# Patient Record
Sex: Female | Born: 1995
Health system: Southern US, Community
[De-identification: ages and names within clinical notes are randomized; demographics above are authoritative.]

## PROBLEM LIST (undated history)

## (undated) ENCOUNTER — Inpatient Hospital Stay (HOSPITAL_COMMUNITY): Payer: Self-pay

## (undated) DIAGNOSIS — D75839 Thrombocytosis, unspecified: Secondary | ICD-10-CM

## (undated) DIAGNOSIS — R55 Syncope and collapse: Secondary | ICD-10-CM

## (undated) DIAGNOSIS — I951 Orthostatic hypotension: Secondary | ICD-10-CM

## (undated) DIAGNOSIS — O24419 Gestational diabetes mellitus in pregnancy, unspecified control: Secondary | ICD-10-CM

## (undated) DIAGNOSIS — J45909 Unspecified asthma, uncomplicated: Secondary | ICD-10-CM

## (undated) DIAGNOSIS — R569 Unspecified convulsions: Secondary | ICD-10-CM

## (undated) DIAGNOSIS — R000 Tachycardia, unspecified: Secondary | ICD-10-CM

## (undated) DIAGNOSIS — J302 Other seasonal allergic rhinitis: Secondary | ICD-10-CM

## (undated) DIAGNOSIS — G90A Postural orthostatic tachycardia syndrome (POTS): Secondary | ICD-10-CM

## (undated) DIAGNOSIS — G40209 Localization-related (focal) (partial) symptomatic epilepsy and epileptic syndromes with complex partial seizures, not intractable, without status epilepticus: Secondary | ICD-10-CM

## (undated) DIAGNOSIS — R739 Hyperglycemia, unspecified: Secondary | ICD-10-CM

## (undated) DIAGNOSIS — F419 Anxiety disorder, unspecified: Secondary | ICD-10-CM

## (undated) DIAGNOSIS — I498 Other specified cardiac arrhythmias: Secondary | ICD-10-CM

## (undated) DIAGNOSIS — E669 Obesity, unspecified: Secondary | ICD-10-CM

## (undated) DIAGNOSIS — T7840XA Allergy, unspecified, initial encounter: Secondary | ICD-10-CM

## (undated) DIAGNOSIS — E119 Type 2 diabetes mellitus without complications: Secondary | ICD-10-CM

## (undated) DIAGNOSIS — D473 Essential (hemorrhagic) thrombocythemia: Secondary | ICD-10-CM

## (undated) HISTORY — DX: Localization-related (focal) (partial) symptomatic epilepsy and epileptic syndromes with complex partial seizures, not intractable, without status epilepticus: G40.209

## (undated) HISTORY — DX: Essential (hemorrhagic) thrombocythemia: D47.3

## (undated) HISTORY — DX: Syncope and collapse: R55

## (undated) HISTORY — DX: Other specified cardiac arrhythmias: I49.8

## (undated) HISTORY — DX: Orthostatic hypotension: I95.1

## (undated) HISTORY — DX: Postural orthostatic tachycardia syndrome (POTS): G90.A

## (undated) HISTORY — DX: Obesity, unspecified: E66.9

## (undated) HISTORY — DX: Allergy, unspecified, initial encounter: T78.40XA

## (undated) HISTORY — DX: Tachycardia, unspecified: R00.0

## (undated) HISTORY — PX: LINGUAL FRENECTOMY: SHX6357

## (undated) HISTORY — PX: TYMPANOSTOMY TUBE PLACEMENT: SHX32

## (undated) HISTORY — DX: Thrombocytosis, unspecified: D75.839

## (undated) HISTORY — PX: MYRINGOTOMY: SUR874

## (undated) HISTORY — DX: Other seasonal allergic rhinitis: J30.2

## (undated) HISTORY — DX: Hyperglycemia, unspecified: R73.9

## (undated) HISTORY — DX: Unspecified asthma, uncomplicated: J45.909

## (undated) HISTORY — DX: Type 2 diabetes mellitus without complications: E11.9

## (undated) HISTORY — DX: Unspecified convulsions: R56.9

---
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
## 2014-06-22 ENCOUNTER — Telehealth: Payer: Self-pay

## 2014-06-22 NOTE — Telephone Encounter (Signed)
I'm going to recommend that she call them (GYN). They should have someone always available and on-call as well. I hope this resolves soon.

## 2014-06-22 NOTE — Telephone Encounter (Signed)
She was seen at [facility], they started her on Junel 1/20. She got her period on Monday, it's been very heavy, blood clots, cramping.  She's tried calling their office and hasn't gotten a call back. She wants to know if you had any advice?

## 2014-06-23 NOTE — Telephone Encounter (Signed)
Pt. Notified.

## 2014-07-21 ENCOUNTER — Encounter: Payer: Self-pay | Admitting: Family Medicine

## 2014-09-27 DIAGNOSIS — G90A Postural orthostatic tachycardia syndrome (POTS): Secondary | ICD-10-CM | POA: Insufficient documentation

## 2014-09-27 DIAGNOSIS — I951 Orthostatic hypotension: Secondary | ICD-10-CM

## 2014-09-27 DIAGNOSIS — D75839 Thrombocytosis, unspecified: Secondary | ICD-10-CM | POA: Insufficient documentation

## 2014-09-27 DIAGNOSIS — D473 Essential (hemorrhagic) thrombocythemia: Secondary | ICD-10-CM | POA: Insufficient documentation

## 2014-09-27 DIAGNOSIS — J45909 Unspecified asthma, uncomplicated: Secondary | ICD-10-CM | POA: Insufficient documentation

## 2014-09-27 DIAGNOSIS — R739 Hyperglycemia, unspecified: Secondary | ICD-10-CM | POA: Insufficient documentation

## 2014-09-27 DIAGNOSIS — R000 Tachycardia, unspecified: Secondary | ICD-10-CM

## 2014-09-27 DIAGNOSIS — G40209 Localization-related (focal) (partial) symptomatic epilepsy and epileptic syndromes with complex partial seizures, not intractable, without status epilepticus: Secondary | ICD-10-CM | POA: Insufficient documentation

## 2014-09-27 DIAGNOSIS — E669 Obesity, unspecified: Secondary | ICD-10-CM | POA: Insufficient documentation

## 2014-10-09 ENCOUNTER — Encounter: Payer: Self-pay | Admitting: Family Medicine

## 2015-02-14 ENCOUNTER — Ambulatory Visit (INDEPENDENT_AMBULATORY_CARE_PROVIDER_SITE_OTHER): Payer: BLUE CROSS/BLUE SHIELD | Admitting: Family Medicine

## 2015-02-14 ENCOUNTER — Encounter: Payer: Self-pay | Admitting: Family Medicine

## 2015-02-14 VITALS — BP 111/71 | HR 70 | Temp 98.8°F | Ht 65.75 in | Wt 211.0 lb

## 2015-02-14 DIAGNOSIS — G40209 Localization-related (focal) (partial) symptomatic epilepsy and epileptic syndromes with complex partial seizures, not intractable, without status epilepticus: Secondary | ICD-10-CM

## 2015-02-14 DIAGNOSIS — N910 Primary amenorrhea: Secondary | ICD-10-CM | POA: Diagnosis not present

## 2015-02-14 DIAGNOSIS — E669 Obesity, unspecified: Secondary | ICD-10-CM

## 2015-02-14 DIAGNOSIS — Z23 Encounter for immunization: Secondary | ICD-10-CM

## 2015-02-14 DIAGNOSIS — Z3009 Encounter for other general counseling and advice on contraception: Secondary | ICD-10-CM | POA: Insufficient documentation

## 2015-02-14 DIAGNOSIS — N926 Irregular menstruation, unspecified: Secondary | ICD-10-CM | POA: Insufficient documentation

## 2015-02-14 NOTE — Assessment & Plan Note (Signed)
Discussed IUD as an option for contraception; may be better fit for her since she is having trouble remembering to take her pill at the same time of day every day; referral entered

## 2015-02-14 NOTE — Patient Instructions (Addendum)
Your next pneumonia vaccine (PPSV-23) will be due when you turn 20 years of age Return in 5 months for your next and last hepatitis B vaccine Think about the Gardisil vaccine, and you can get that any time up until age 90 You received the flu shot today; it should protect you against the flu virus over the coming months; it will take about two weeks for antibodies to develop; do try to stay away from hospitals, nursing homes, and daycares during peak flu season; taking extra vitamin C daily during flu season may help you avoid getting sick I'll suggest getting your next flu shot in the Fall of 2017 (October or November)

## 2015-02-14 NOTE — Assessment & Plan Note (Signed)
Praised patient for the weight loss over last several months
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
## 2015-02-14 NOTE — Assessment & Plan Note (Signed)
Managed by Dr. Manuella Ghazi

## 2015-02-14 NOTE — Assessment & Plan Note (Addendum)
Offered pregnancy test today; she declined; discussed importance of taking OCP at same time of day for effectiveness; will put in phone note to staff to call her Friday to see if she has started yet, offer pregnancy test then if not

## 2015-02-16 ENCOUNTER — Telehealth: Payer: Self-pay | Admitting: Family Medicine

## 2015-02-16 ENCOUNTER — Other Ambulatory Visit: Payer: BLUE CROSS/BLUE SHIELD

## 2015-02-16 DIAGNOSIS — N926 Irregular menstruation, unspecified: Secondary | ICD-10-CM

## 2015-02-16 DIAGNOSIS — Z0184 Encounter for antibody response examination: Secondary | ICD-10-CM | POA: Insufficient documentation

## 2015-02-16 DIAGNOSIS — Z111 Encounter for screening for respiratory tuberculosis: Secondary | ICD-10-CM

## 2015-02-16 DIAGNOSIS — Z789 Other specified health status: Secondary | ICD-10-CM | POA: Insufficient documentation

## 2015-02-16 NOTE — Telephone Encounter (Signed)
Patient notified, she will come by this afternoon for labs.

## 2015-02-16 NOTE — Assessment & Plan Note (Signed)
Check serum qualitative pregnancy test

## 2015-02-16 NOTE — Telephone Encounter (Signed)
-----   Message from [place] sent at 02/16/2015 10:46 AM EST ----- Regarding: RE: Check on patient Friday She still has not started her period. She took a home pregnancy test and it was negative. She wants to know if maybe she should get a blood test done. ----- Message -----    From: Arnetha Courser, MD    Sent: 02/14/2015   5:00 PM      To: Staci Acosta, CMA Subject: Check on patient Friday                        Please call patient on Friday Jan 27th to see if her period has come yet. If not, please recommend a urine pregnancy test.

## 2015-02-16 NOTE — Telephone Encounter (Signed)
Pt needs to get MMR and TB blood test done.  I've scheduled her for 03/01/15 for these.  Just wanted to give a heads up to get orders in and make sure we have the injections.  Please call her with any questions.

## 2015-02-16 NOTE — Telephone Encounter (Signed)
Yes, please do have her come in for blood test

## 2015-02-16 NOTE — Telephone Encounter (Signed)
I've entered the orders for the blood tests

## 2015-02-16 NOTE — Telephone Encounter (Signed)
Routing to provider, needs orders placed.

## 2015-02-17 LAB — HCG, SERUM, QUALITATIVE: hCG,Beta Subunit,Qual,Serum: NEGATIVE m[IU]/mL (ref <6)

## 2015-03-01 ENCOUNTER — Ambulatory Visit: Payer: BLUE CROSS/BLUE SHIELD

## 2015-03-01 DIAGNOSIS — Z111 Encounter for screening for respiratory tuberculosis: Secondary | ICD-10-CM

## 2015-03-01 DIAGNOSIS — Z789 Other specified health status: Secondary | ICD-10-CM

## 2015-03-01 DIAGNOSIS — Z0184 Encounter for antibody response examination: Secondary | ICD-10-CM

## 2015-03-02 LAB — MEASLES/MUMPS/RUBELLA IMMUNITY
MUMPS ABS, IGG: 16.8 AU/mL (ref >10.9)
RUBEOLA AB, IGG: 114 AU/mL (ref >29.9)
Rubella Antibodies, IGG: 2.93 index (ref >0.99)

## 2015-03-05 LAB — QUANTIFERON IN TUBE
QFT TB AG MINUS NIL VALUE: 0.01 IU/mL
QUANTIFERON NIL VALUE: 0.03 [IU]/mL
QUANTIFERON TB AG VALUE: 0.04 [IU]/mL
QUANTIFERON TB GOLD: NEGATIVE

## 2015-03-05 LAB — QUANTIFERON TB GOLD ASSAY (BLOOD)

## 2015-04-11 ENCOUNTER — Other Ambulatory Visit: Payer: Self-pay | Admitting: Obstetrics and Gynecology

## 2015-04-11 ENCOUNTER — Encounter: Payer: Self-pay | Admitting: Obstetrics and Gynecology

## 2015-04-11 ENCOUNTER — Ambulatory Visit (INDEPENDENT_AMBULATORY_CARE_PROVIDER_SITE_OTHER): Payer: BLUE CROSS/BLUE SHIELD | Admitting: Obstetrics and Gynecology

## 2015-04-11 VITALS — BP 123/83 | HR 93 | Ht 68.0 in | Wt 211.8 lb

## 2015-04-11 DIAGNOSIS — N926 Irregular menstruation, unspecified: Secondary | ICD-10-CM

## 2015-04-11 DIAGNOSIS — E669 Obesity, unspecified: Secondary | ICD-10-CM

## 2015-04-11 DIAGNOSIS — Z309 Encounter for contraceptive management, unspecified: Secondary | ICD-10-CM

## 2015-04-11 DIAGNOSIS — Z01419 Encounter for gynecological examination (general) (routine) without abnormal findings: Secondary | ICD-10-CM

## 2015-04-11 DIAGNOSIS — Z3009 Encounter for other general counseling and advice on contraception: Secondary | ICD-10-CM

## 2015-04-11 LAB — POCT URINE PREGNANCY: PREG TEST UR: NEGATIVE

## 2015-04-11 NOTE — Progress Notes (Signed)
  Subjective:     Diane Guerrero is a 20 y.o. female and is here for a comprehensive physical exam. The patient reports no problems, except pain at insertion with intercourse.  Social History   Social History  . Marital Status: Single    Spouse Name: N/A  . Number of Children: N/A  . Years of Education: N/A   Occupational History  . Not on file.   Social History Main Topics  . Smoking status: Never Smoker   . Smokeless tobacco: Never Used  . Alcohol Use: No  . Drug Use: No  . Sexual Activity: Yes    Birth Control/ Protection: Pill   Other Topics Concern  . Not on file   Social History Narrative   Health Maintenance  Topic Date Due  . PAP SMEAR  03/08/2015  . INFLUENZA VACCINE  08/21/2015  . TETANUS/TDAP  06/17/2023  . HIV Screening  Addressed    The following portions of the patient's history were reviewed and updated as appropriate: allergies, current medications, past family history, past medical history, past social history, past surgical history and problem list.  Review of Systems Pertinent items noted in HPI and remainder of comprehensive ROS otherwise negative.   Objective:    BP 123/83 mmHg  Pulse 93  Ht 5\' 8"  (1.727 m)  Wt 211 lb 12.8 oz (96.072 kg)  BMI 32.21 kg/m2  LMP 04/02/2015 General appearance: alert, cooperative and appears stated age Neck: no adenopathy, no carotid bruit, no JVD, supple, symmetrical, trachea midline and thyroid not enlarged, symmetric, no tenderness/mass/nodules Lungs: clear to auscultation bilaterally Breasts: normal appearance, no masses or tenderness Heart: regular rate and rhythm, S1, S2 normal, no murmur, click, rub or gallop Abdomen: soft, non-tender; bowel sounds normal; no masses,  no organomegaly Pelvic: cervix normal in appearance, external genitalia normal, no adnexal masses or tenderness, no cervical motion tenderness, rectovaginal septum normal, uterus normal size, shape, and consistency and vagina normal  without discharge    Assessment:    Healthy female exam. Contraception counseling; obesity      Plan:  Pap obtained RTC in 2 weeks for Kyleena insertion Estrace cream for massage at posterior vaginal introitus prn   See After Visit Summary for Counseling Recommendations

## 2015-04-11 NOTE — Patient Instructions (Signed)
Place annual gynecologic exam patient instructions here.

## 2015-04-13 LAB — CYTOLOGY - PAP

## 2015-04-19 ENCOUNTER — Telehealth: Payer: Self-pay

## 2015-04-19 NOTE — Telephone Encounter (Signed)
Error

## 2015-04-25 ENCOUNTER — Encounter: Payer: Self-pay | Admitting: Obstetrics and Gynecology

## 2015-04-25 ENCOUNTER — Ambulatory Visit (INDEPENDENT_AMBULATORY_CARE_PROVIDER_SITE_OTHER): Payer: BLUE CROSS/BLUE SHIELD | Admitting: Obstetrics and Gynecology

## 2015-04-25 VITALS — BP 141/79 | HR 87 | Ht 68.0 in | Wt 217.6 lb

## 2015-04-25 DIAGNOSIS — Z975 Presence of (intrauterine) contraceptive device: Secondary | ICD-10-CM

## 2015-04-25 LAB — POCT URINE PREGNANCY: Preg Test, Ur: NEGATIVE

## 2015-04-25 MED ORDER — LEVONORGESTREL 19.5 MG IU IUD: 1.0000 | INTRAUTERINE_SYSTEM | Freq: Once | INTRAUTERINE | Status: DC

## 2015-04-25 NOTE — Progress Notes (Signed)
Diane Guerrero is a 20 y.o. year old G34P0000 Caucasian female who presents for placement of a [place] IUD.  Patient's last menstrual period was 04/02/2015. BP 141/79 mmHg  Pulse 87  Ht 5\' 8"  (1.727 m)  Wt 217 lb 9.6 oz (98.703 kg)  BMI 33.09 kg/m2  LMP 04/02/2015 Last sexual intercourse was 3 weeks ago, and pregnancy test today was negative  The risks and benefits of the method and placement have been thouroughly reviewed with the patient and all questions were answered.  Specifically the patient is aware of failure rate of 01/998, expulsion of the IUD and of possible perforation.  The patient is aware of irregular bleeding due to the method and understands the incidence of irregular bleeding diminishes with time.  Signed copy of informed consent in chart.   Time out was performed.  A pederson speculum was placed in the vagina.  The cervix was visualized, prepped using Betadine, and grasped with a single tooth tenaculum. The uterus was found to be neutral and it sounded to 8 cm.   IUD placed per manufacturer's recommendations.   The strings were trimmed to 3 cm.   The patient was given post procedure instructions, including signs and symptoms of infection and to check for the strings after each menses or each month, and refraining from intercourse or anything in the vagina for 3 days.  She was given a  care card with date placed, and date  to be removed.    Niralya Ohanian Rockney Ghee, CNM

## 2015-04-25 NOTE — Patient Instructions (Signed)

## 2015-04-30 ENCOUNTER — Telehealth: Payer: Self-pay | Admitting: Obstetrics and Gynecology

## 2015-04-30 NOTE — Telephone Encounter (Signed)
Notified pt to hydrate and let us know if not any better

## 2015-04-30 NOTE — Telephone Encounter (Signed)
Pt had IUID put in and she started her period and now her left side lower abdomen is hurting swollen and tender, she wants to know if this is normal

## 2015-05-16 ENCOUNTER — Telehealth: Payer: Self-pay | Admitting: *Deleted

## 2015-05-16 ENCOUNTER — Other Ambulatory Visit: Payer: Self-pay | Admitting: Obstetrics and Gynecology

## 2015-05-16 MED ORDER — FLUCONAZOLE 150 MG PO TABS: 150.0000 mg | ORAL_TABLET | Freq: Once | ORAL | Status: DC

## 2015-05-16 NOTE — Telephone Encounter (Signed)
Please let her know I sent in a prescription for Diflucan, and symptoms should resolve within 1 week, if they don't to call Monday for an appt on Tues or Wed to be seen

## 2015-05-16 NOTE — Telephone Encounter (Signed)
Should i suggest her some monistat???

## 2015-05-16 NOTE — Telephone Encounter (Signed)
Notified pt she voiced understanding 

## 2015-05-16 NOTE — Telephone Encounter (Signed)
Patient called and states that she  is experiencing some discharge and vaginal itching and burning. Patient states that she don't know if it could be from  the IUD insertion or from her having a yeast infection. Patient was on antibiotic for 3 days for upper respiratory infection and a double ear infection . Patient is requesting a call back. Call back number is [phone].

## 2015-05-18 ENCOUNTER — Encounter: Payer: Self-pay | Admitting: *Deleted

## 2015-05-18 ENCOUNTER — Ambulatory Visit
Admission: EM | Admit: 2015-05-18 | Discharge: 2015-05-18 | Disposition: A | Discharge status: Home / Self Care | Payer: BLUE CROSS/BLUE SHIELD | Attending: Family Medicine | Admitting: Family Medicine

## 2015-05-18 DIAGNOSIS — R42 Dizziness and giddiness: Secondary | ICD-10-CM

## 2015-05-18 DIAGNOSIS — R5383 Other fatigue: Secondary | ICD-10-CM

## 2015-05-18 NOTE — ED Notes (Signed)
Patient started having symptoms of dizziness 3 days ago. Patient had a double ear infection and URI last week and was treated. Patient is a [employer].

## 2015-05-18 NOTE — ED Provider Notes (Signed)
CSN: [id number]     Arrival date & time 05/18/15  1933 History   First MD Initiated Contact with Patient 05/18/15 2036     Chief Complaint  Patient presents with  . Dizziness  . Weakness   (Consider location/radiation/quality/duration/timing/severity/associated sxs/prior Treatment) HPI Comments: 20 yo female with a c/o fatigue, low energy, and lightheadedness for 3 days. States has been working a lot, going to class, and not getting much sleep or rest. Has also been sick over the past 2-3 weeks with URI and bilateral otitis media for which she was treated. Denies any fevers, chills, numbness/tingling, headaches, vision changes, one-sided weakness, rash.   The history is provided by the patient.    Past Medical History  Diagnosis Date  . Seasonal allergies   . Syncope and collapse   . Asthma   . Complex partial seizure ([place])   . Obesity   . POTS (postural orthostatic tachycardia syndrome)   . Thrombocytosis ([place])   . Hyperglycemia    Past Surgical History  Procedure Laterality Date  . Myringotomy Bilateral 1996  . Lingual frenectomy  1996  . Tympanostomy tube placement     Family History  Problem Relation Age of Onset  . Emphysema Maternal Grandmother   . Allergies Maternal Grandmother   . Cancer Maternal Grandmother     bladder  . COPD Paternal Uncle   . Allergies Mother   . Asthma Mother   . Heart disease Mother   . Rheum arthritis Mother   . Lupus Mother   . Stroke Mother   . Heart disease Maternal Grandfather   . Cancer Maternal Grandfather     lung  . Lung disease Maternal Grandfather   . Cancer Paternal Grandmother     breast  . Diabetes Paternal Grandmother    Social History  Substance Use Topics  . Smoking status: Never Smoker   . Smokeless tobacco: Never Used  . Alcohol Use: No   OB History    Gravida Para Term Preterm AB TAB SAB Ectopic Multiple Living   0 0 0 0 0 0 0 0 0 0      Review of Systems  Allergies  Peanut oil and Suprax  Home  Medications   Prior to Admission medications   Medication Sig Start Date End Date Taking? Authorizing Provider  Levonorgestrel (KYLEENA) 19.5 MG IUD 1 Device by Intrauterine route once. 04/25/15  Yes Melody N Shambley, CNM  topiramate (TOPAMAX) 200 MG tablet Take 200 mg by mouth daily.   Yes Historical Provider, MD  Topiramate ER 200 MG CS24 Take 200 mg by mouth daily. 02/07/15 05/18/15 Yes Historical Provider, MD  traZODone (DESYREL) 50 MG tablet Take 25 mg by mouth at bedtime. 11/17/14  Yes Historical Provider, MD  albuterol (PROVENTIL HFA;VENTOLIN HFA) 108 (90 BASE) MCG/ACT inhaler Inhale 1-2 puffs into the lungs every 6 (six) hours as needed for wheezing or shortness of breath.    Historical Provider, MD  albuterol (PROVENTIL) (2.5 MG/3ML) 0.083% nebulizer solution Take 2.5 mg by nebulization every 6 (six) hours as needed for wheezing or shortness of breath.    Historical Provider, MD  beclomethasone (QVAR) 80 MCG/ACT inhaler Inhale 2 puffs into the lungs 2 (two) times daily. Reported on 02/14/2015    Historical Provider, MD  cetirizine (ZYRTEC) 10 MG tablet Take 10 mg by mouth daily.    Historical Provider, MD  Cholecalciferol (VITAMIN D-1000 MAX ST) 1000 units tablet Take 1,000 Units by mouth daily.    Historical Provider, MD  diphenhydrAMINE (BENADRYL) 25 MG tablet Take 25 mg by mouth every 6 (six) hours as needed.    Historical Provider, MD  EPINEPHrine (EPI-PEN) 0.3 mg/0.3 mL SOAJ injection Inject 0.3 mg into the muscle as needed.    Historical Provider, MD  fluconazole (DIFLUCAN) 150 MG tablet Take 1 tablet (150 mg total) by mouth once. Can take additional dose three days later if symptoms persist 05/16/15   Melody N Shambley, CNM  Spacer/Aero-Holding Chambers (AEROCHAMBER MV) inhaler Use as instructed 06/20/13   Juanito Doom, MD   Meds Ordered and Administered this Visit  Medications - No data to display  BP 148/69 mmHg  Pulse 89  Temp(Src) 98 F (36.7 C) (Oral)  Resp 18  Ht 5\' 8"   (1.727 m)  Wt 210 lb (95.255 kg)  BMI 31.94 kg/m2  SpO2 100%  LMP 04/26/2015 No data found.   Physical Exam  Constitutional: She is oriented to person, place, and time. She appears well-developed and well-nourished. No distress.  HENT:  Head: Normocephalic and atraumatic.  Right Ear: External ear and ear canal normal. Tympanic membrane is erythematous.  Left Ear: External ear and ear canal normal. Tympanic membrane is erythematous.  Nose: Nose normal. No nose lacerations, sinus tenderness, nasal deformity, septal deviation or nasal septal hematoma. No epistaxis.  No foreign bodies.  Mouth/Throat: Uvula is midline, oropharynx is clear and moist and mucous membranes are normal. No oropharyngeal exudate.  Eyes: Conjunctivae and EOM are normal. Pupils are equal, round, and reactive to light. Right eye exhibits no discharge. Left eye exhibits no discharge. No scleral icterus.  Neck: Normal range of motion. Neck supple. No thyromegaly present.  Cardiovascular: Normal rate, regular rhythm and normal heart sounds.   Pulmonary/Chest: Effort normal and breath sounds normal. No respiratory distress. She has no wheezes. She has no rales.  Lymphadenopathy:    She has no cervical adenopathy.  Neurological: She is alert and oriented to person, place, and time. She has normal reflexes. She displays normal reflexes. No cranial nerve deficit. She exhibits normal muscle tone. Coordination normal.  Skin: She is not diaphoretic.  Nursing note and vitals reviewed.   ED Course  Procedures (including critical care time)  Labs Review Labs Reviewed - No data to display  Imaging Review No results found.   Visual Acuity Review  Right Eye Distance:   Left Eye Distance:   Bilateral Distance:    Right Eye Near:   Left Eye Near:    Bilateral Near:         MDM   1. Other fatigue   2. Lightheadedness    1.  diagnosis reviewed with patient 2.  Recommend supportive treatment with rest, increased  fluids  3. Follow-up prn if symptoms worsen or don't improve    Norval Gable, MD 05/19/15 [phone]

## 2015-06-11 ENCOUNTER — Ambulatory Visit (INDEPENDENT_AMBULATORY_CARE_PROVIDER_SITE_OTHER): Payer: BLUE CROSS/BLUE SHIELD | Admitting: Obstetrics and Gynecology

## 2015-06-11 ENCOUNTER — Encounter: Payer: Self-pay | Admitting: Obstetrics and Gynecology

## 2015-06-11 VITALS — BP 99/71 | HR 74 | Wt 219.3 lb

## 2015-06-11 DIAGNOSIS — Z30431 Encounter for routine checking of intrauterine contraceptive device: Secondary | ICD-10-CM | POA: Diagnosis not present

## 2015-06-11 NOTE — Progress Notes (Signed)
Subjective:     Patient ID: Diane Guerrero, female   DOB: February 07, 1995, 20 y.o.   MRN: [id number]  HPI IUD placed 6 weeks ago, doing well, reports 7 days of light spotting this month with menses. Denies pain with sex.  Review of Systems See above    Objective:   Physical Exam A&O x4  well groomed female in no distress Blood pressure 99/71, pulse 74, weight 219 lb 4.8 oz (99.474 kg), last menstrual period 05/26/2015. Pelvic exam: normal external genitalia, vulva, vagina, cervix, uterus and adnexa, IUD string noted inside cervix.    Assessment:     IUD check     Plan:     Happy with IUD and desires to continue with it.  RTC as needed.  Elyan Vanwieren Beaver, CNM

## 2015-06-26 ENCOUNTER — Telehealth: Payer: Self-pay | Admitting: Obstetrics and Gynecology

## 2015-06-26 NOTE — Telephone Encounter (Signed)
Pt has been bleeding since 6/1 but very light. She was told to call if she bled past 7 days. She has no pain.

## 2015-06-28 NOTE — Telephone Encounter (Signed)
Pt had IUD placed on 04/25/15. I informed her that even BTB, or menses could occur with IUD but if severe pain, unusually heavy bleeding to contact office. Pt states she had called because she was told to if bleeding persisted past 7 days. Encourage to call if any further questions or concerns.

## 2015-07-16 ENCOUNTER — Ambulatory Visit: Payer: BLUE CROSS/BLUE SHIELD

## 2015-08-10 ENCOUNTER — Telehealth: Payer: Self-pay | Admitting: Family Medicine

## 2015-08-10 NOTE — Telephone Encounter (Signed)
Pt called and states she has IUD as of May,  she is in [place] and the last week or so her right breast seems larger and more tender than the other. Pt states there are no other symptoms and all was fine until IUD. Pt would like a call back. Pt states she understands it could be her lifestyle, however she does have breast cancer in her immediate family and would just feel better to have a call back. [phone].

## 2015-08-13 ENCOUNTER — Encounter: Payer: Self-pay | Admitting: Family Medicine

## 2015-08-13 NOTE — Telephone Encounter (Signed)
I returned her call, recommended she contact her gynecologist Patient is traveling She thinks she is fine; her right breast was swollen, it freaked her out I encouraged her to contact her GYN; so glad she is listening to her body and wanting to make sure everything is okay She agrees to call GYN

## 2015-08-16 ENCOUNTER — Ambulatory Visit
Admission: EM | Admit: 2015-08-16 | Discharge: 2015-08-16 | Disposition: A | Discharge status: Home / Self Care | Payer: BLUE CROSS/BLUE SHIELD | Attending: Family Medicine | Admitting: Family Medicine

## 2015-08-16 DIAGNOSIS — J0100 Acute maxillary sinusitis, unspecified: Secondary | ICD-10-CM | POA: Diagnosis not present

## 2015-08-16 DIAGNOSIS — B353 Tinea pedis: Secondary | ICD-10-CM

## 2015-08-16 DIAGNOSIS — J029 Acute pharyngitis, unspecified: Secondary | ICD-10-CM

## 2015-08-16 DIAGNOSIS — L600 Ingrowing nail: Secondary | ICD-10-CM

## 2015-08-16 LAB — RAPID STREP SCREEN (MED CTR MEBANE ONLY): Streptococcus, Group A Screen (Direct): NEGATIVE

## 2015-08-16 MED ORDER — KETOCONAZOLE 2 % EX CREA
1.0000 | TOPICAL_CREAM | Freq: Two times a day (BID) | CUTANEOUS | 1 refills | Status: DC
Start: 2015-08-16 — End: 2015-10-28

## 2015-08-16 MED ORDER — FEXOFENADINE-PSEUDOEPHED ER 180-240 MG PO TB24: 1.0000 | ORAL_TABLET | Freq: Every day | ORAL | 0 refills | Status: DC

## 2015-08-16 MED ORDER — MOXIFLOXACIN HCL 400 MG PO TABS: 400.0000 mg | ORAL_TABLET | Freq: Every day | ORAL | 0 refills | Status: DC

## 2015-08-16 MED ORDER — FLUTICASONE PROPIONATE 50 MCG/ACT NA SUSP: 2.0000 | Freq: Every day | NASAL | 0 refills | Status: DC

## 2015-08-16 NOTE — ED Triage Notes (Signed)
Patient complains of sore throat, cough, headache, sinus pain and pressure that has been constant for 3-4 weeks. Patient complains of ingrown right big toenail that started 8 months ago that she has been trying to doctor on herself. Patient states that she also has a rash on bilateral feet that worsened recently after living in a hotel.

## 2015-08-16 NOTE — ED Provider Notes (Signed)
[facility]    CSN: [id number] Arrival date & time: 08/16/15  1602  First Provider Contact:  First MD Initiated Contact with Patient 08/16/15 1807        History   Chief Complaint Chief Complaint  Patient presents with  . Sinusitis  . Ingrown Toenail    right big toe  . Rash    bilateral feet    HPI Diane Guerrero is a 20 y.o. female.   Patient's here because multiple problems.  #1 she's had sinus infection pharyngitis. 2-3 weeks. She reports postnasal drainage sore throat irritation. Pressure behind the ears. She is coughing but doesn't feel like the infections got into her chest yet. She does feel pressure in behind eyes as well. She does not smoke. She has a history of seasonal allergies and asthma.  #2 pharyngitis. She has strep the pharyngitis also been going on for about 2 weeks   #3 ingrown toenail on the right foot she says ingrown toenail this been going on for over 67 weeks. The nails gone into the medial border of the great toe. Soaks she started place cotton underneath the nail to lift it has not been successful    #4 rash she has a rash on the dorsum of the right foot and also the instep of the left foot is obviously she is scratching and there is some excoriation present as well    She does not smoke. No pertinent family medical history pertaining to today's visit. She is allergic to Suprax. She denies any significant medical problems.   The history is provided by the patient. No language interpreter was used.  Sinusitis  Pain details:    Location:  Maxillary   Quality:  Pressure   Severity:  Moderate   Duration:  2 weeks   Timing:  Constant Progression:  Worsening Chronicity:  New Context: not allergies, not chemical odor, not deviated nasal septum, not recent URI and not smoke inhalation   Relieved by:  Nothing Worsened by:  Nothing Ineffective treatments:  None tried Associated symptoms: congestion, rhinorrhea and sore throat     Associated symptoms: no chest pain   Sore throat:    Severity:  Moderate   Timing:  Constant   Progression:  Unchanged Risk factors: no allergic reaction, no asthma, no immune deficiency and no nasal cannula   Rash  Location:  Foot Foot rash location:  Top of R foot (arch of L foot) Severity:  Moderate Timing:  Constant Progression:  Worsening Context: not animal contact and not chemical exposure   Relieved by:  Nothing Associated symptoms: sore throat   Associated symptoms: no abdominal pain   Toe Pain  This is a new problem. The current episode started more than 1 week ago. The problem has been gradually worsening. Pertinent negatives include no chest pain and no abdominal pain. Nothing aggravates the symptoms. The treatment provided no relief.    Past Medical History:  Diagnosis Date  . Asthma   . Complex partial seizure ([place])   . Hyperglycemia   . Obesity   . POTS (postural orthostatic tachycardia syndrome)   . Seasonal allergies   . Syncope and collapse   . Thrombocytosis [facility])     Patient Active Problem List   Diagnosis Date Noted  . Rubella immune status not known 02/16/2015  . Immunity status testing 02/16/2015  . Screening-pulmonary TB 02/16/2015  . General counseling and advice on contraceptive management 02/14/2015  . Late period 02/14/2015  .  Asthma   . Complex partial seizure ([place])   . Obesity   . POTS (postural orthostatic tachycardia syndrome)   . Thrombocytosis ([place])   . Hyperglycemia   . Headache 06/23/2013  . Syncope 05/26/2013  . Asthma 04/27/2013  . Allergic rhinitis 04/27/2013    Past Surgical History:  Procedure Laterality Date  . LINGUAL FRENECTOMY  1996  . MYRINGOTOMY Bilateral 1996  . TYMPANOSTOMY TUBE PLACEMENT      OB History    Gravida Para Term Preterm AB Living   0 0 0 0 0 0   SAB TAB Ectopic Multiple Live Births   0 0 0 0         Home Medications    Prior to Admission medications   Medication Sig Start Date End  Date Taking? Authorizing Provider  albuterol (PROVENTIL HFA;VENTOLIN HFA) 108 (90 BASE) MCG/ACT inhaler Inhale 1-2 puffs into the lungs every 6 (six) hours as needed for wheezing or shortness of breath.   Yes Historical Provider, MD  beclomethasone (QVAR) 80 MCG/ACT inhaler Inhale 2 puffs into the lungs 2 (two) times daily. Reported on 02/14/2015   Yes Historical Provider, MD  cetirizine (ZYRTEC) 10 MG tablet Take 10 mg by mouth daily.   Yes Historical Provider, MD  Cholecalciferol (VITAMIN D-1000 MAX ST) 1000 units tablet Take 1,000 Units by mouth daily.   Yes Historical Provider, MD  diphenhydrAMINE (BENADRYL) 25 MG tablet Take 25 mg by mouth every 6 (six) hours as needed.   Yes Historical Provider, MD  EPINEPHrine (EPI-PEN) 0.3 mg/0.3 mL SOAJ injection Inject 0.3 mg into the muscle as needed.   Yes Historical Provider, MD  Levonorgestrel (KYLEENA) 19.5 MG IUD 1 Device by Intrauterine route once. 04/25/15  Yes Melody N Shambley, CNM  Topiramate ER (TROKENDI XR) 200 MG CP24 Take by mouth.   Yes Historical Provider, MD  traZODone (DESYREL) 50 MG tablet Take 25 mg by mouth at bedtime. 11/17/14  Yes Historical Provider, MD  albuterol (PROVENTIL) (2.5 MG/3ML) 0.083% nebulizer solution Take 2.5 mg by nebulization every 6 (six) hours as needed for wheezing or shortness of breath.    Historical Provider, MD  fexofenadine-pseudoephedrine (ALLEGRA-D ALLERGY & CONGESTION) 180-240 MG 24 hr tablet Take 1 tablet by mouth daily. 08/16/15   Frederich Cha, MD  fluconazole (DIFLUCAN) 150 MG tablet Take 1 tablet (150 mg total) by mouth once. Can take additional dose three days later if symptoms persist Patient not taking: Reported on 06/11/2015 05/16/15   Melody N Shambley, CNM  fluticasone (FLONASE) 50 MCG/ACT nasal spray Place 2 sprays into both nostrils daily. 08/16/15   Frederich Cha, MD  ketoconazole (NIZORAL) 2 % cream Apply 1 application topically 2 (two) times daily. 08/16/15   Frederich Cha, MD  moxifloxacin (AVELOX) 400  MG tablet Take 1 tablet (400 mg total) by mouth daily at 8 pm. 08/16/15   Frederich Cha, MD  Spacer/Aero-Holding Chambers (AEROCHAMBER MV) inhaler Use as instructed 06/20/13   Juanito Doom, MD  topiramate (TOPAMAX) 200 MG tablet Take 200 mg by mouth daily.    Historical Provider, MD  Topiramate ER 200 MG CS24 Take 200 mg by mouth daily. 02/07/15 05/18/15  Historical Provider, MD    Family History Family History  Problem Relation Age of Onset  . Allergies Mother   . Asthma Mother   . Heart disease Mother   . Rheum arthritis Mother   . Lupus Mother   . Stroke Mother   . Emphysema Maternal Grandmother   .  Allergies Maternal Grandmother   . Cancer Maternal Grandmother     bladder  . COPD Paternal Uncle   . Heart disease Maternal Grandfather   . Cancer Maternal Grandfather     lung  . Lung disease Maternal Grandfather   . Cancer Paternal Grandmother     breast  . Diabetes Paternal Grandmother     Social History Social History  Substance Use Topics  . Smoking status: Never Smoker  . Smokeless tobacco: Never Used  . Alcohol use No     Allergies   Peanut oil and Suprax [cefixime]   Review of Systems Review of Systems  HENT: Positive for congestion, rhinorrhea and sore throat.   Cardiovascular: Negative for chest pain.  Gastrointestinal: Negative for abdominal pain.  Skin: Positive for rash.  All other systems reviewed and are negative.    Physical Exam Triage Vital Signs ED Triage Vitals  Enc Vitals Group     BP 08/16/15 1721 111/77     Pulse Rate 08/16/15 1721 86     Resp 08/16/15 1721 16     Temp 08/16/15 1721 98.1 F (36.7 C)     Temp Source 08/16/15 1721 Oral     SpO2 08/16/15 1721 100 %     Weight 08/16/15 1721 220 lb (99.8 kg)     Height 08/16/15 1721 5\' 10"  (1.778 m)     Head Circumference --      Peak Flow --      Pain Score 08/16/15 1726 0     Pain Loc --      Pain Edu? --      Excl. in [place]? --    No data found.   Updated Vital Signs BP 111/77  (BP Location: Left Arm)   Pulse 86   Temp 98.1 F (36.7 C) (Oral)   Resp 16   Ht 5\' 10"  (1.778 m)   Wt 220 lb (99.8 kg)   LMP 07/21/2015 (Approximate)   SpO2 100%   BMI 31.57 kg/m   Visual Acuity Right Eye Distance:   Left Eye Distance:   Bilateral Distance:    Right Eye Near:   Left Eye Near:    Bilateral Near:     Physical Exam  Constitutional: She is oriented to person, place, and time. She appears well-developed and well-nourished.  HENT:  Head: Normocephalic.  Right Ear: Hearing normal.  Left Ear: Hearing normal.  Nose: Mucosal edema and rhinorrhea present. Right sinus exhibits maxillary sinus tenderness. Left sinus exhibits maxillary sinus tenderness.  Mouth/Throat: Posterior oropharyngeal erythema present.  Eyes: Pupils are equal, round, and reactive to light.  Neck: Neck supple.  Cardiovascular: Regular rhythm.   Pulmonary/Chest: Effort normal.  Musculoskeletal: She exhibits tenderness. She exhibits no deformity.       Feet:  Rash on the instep of the left foot and on the dorsum of the right foot and her right toes ingrown as well.  Neurological: She is alert and oriented to person, place, and time.  Skin: Skin is warm. Rash noted. There is erythema.  Psychiatric: She has a normal mood and affect.  Vitals reviewed.    UC Treatments / Results  Labs (all labs ordered are listed, but only abnormal results are displayed) Labs Reviewed  RAPID STREP SCREEN (NOT AT [facility])   EKG  EKG Interpretation None       Radiology No results found.  Procedures Procedures (including critical care time)  Medications Ordered in UC Medications - No data to display  Results for orders placed or performed during the hospital encounter of 08/16/15  Rapid strep screen  Result Value Ref Range   Streptococcus, Group A Screen (Direct) NEGATIVE NEGATIVE   Initial Impression / Assessment and Plan / UC Course  I have reviewed the triage vital signs and the nursing  notes.  Pertinent labs & imaging results that were available during my care of the patient were reviewed by me and considered in my medical decision making (see chart for details).  Clinical Course  Patient will have rapid strep test done. Will going to place her on Avelox for the ingrown toenail sinusitis. Initially consider Ceftin 500 but patient states her mother told that she had anaphylactic reaction to Suprax as child so we will hold off on the septal Sporn which revealed been the drug of choice I think and child Avelox. Will use Nizoral cream for the fungal infection of the foot. Or add Allegra-D with the Flonase and Avelox for sinus infection. I've instructed patient to return in about 1-3 weeks if ingrown toenails got better after warm soaks and Avelox may need to remove the toenail completely. I've informed her that she can answer me she was at the toenail removed at the urgent care.      Final Clinical Impressions(s) / UC Diagnoses   Final diagnoses:  Acute maxillary sinusitis, recurrence not specified  Acute pharyngitis, unspecified etiology  Tinea pedis of both feet  Ingrowing nail, right great toe    New Prescriptions New Prescriptions   FEXOFENADINE-PSEUDOEPHEDRINE (ALLEGRA-D ALLERGY & CONGESTION) 180-240 MG 24 HR TABLET    Take 1 tablet by mouth daily.   FLUTICASONE (FLONASE) 50 MCG/ACT NASAL SPRAY    Place 2 sprays into both nostrils daily.   KETOCONAZOLE (NIZORAL) 2 % CREAM    Apply 1 application topically 2 (two) times daily.   MOXIFLOXACIN (AVELOX) 400 MG TABLET    Take 1 tablet (400 mg total) by mouth daily at 8 pm.     Frederich Cha, MD 08/16/15 1902

## 2015-08-19 LAB — CULTURE, GROUP A STREP (THRC)

## 2015-10-28 ENCOUNTER — Encounter: Payer: Self-pay | Admitting: Emergency Medicine

## 2015-10-28 ENCOUNTER — Emergency Department: Payer: BLUE CROSS/BLUE SHIELD

## 2015-10-28 ENCOUNTER — Emergency Department
Admission: EM | Admit: 2015-10-28 | Discharge: 2015-10-28 | Disposition: A | Discharge status: Home / Self Care | Payer: BLUE CROSS/BLUE SHIELD | Attending: Emergency Medicine | Admitting: Emergency Medicine

## 2015-10-28 DIAGNOSIS — G40909 Epilepsy, unspecified, not intractable, without status epilepticus: Secondary | ICD-10-CM

## 2015-10-28 DIAGNOSIS — G43909 Migraine, unspecified, not intractable, without status migrainosus: Secondary | ICD-10-CM

## 2015-10-28 DIAGNOSIS — J45909 Unspecified asthma, uncomplicated: Secondary | ICD-10-CM | POA: Diagnosis not present

## 2015-10-28 DIAGNOSIS — R569 Unspecified convulsions: Secondary | ICD-10-CM | POA: Diagnosis present

## 2015-10-28 DIAGNOSIS — Z79899 Other long term (current) drug therapy: Secondary | ICD-10-CM | POA: Diagnosis not present

## 2015-10-28 LAB — CBC WITH DIFFERENTIAL/PLATELET
Basophils Absolute: 0.1 10*3/uL (ref 0–0.1)
Basophils Relative: 1 %
Eosinophils Absolute: 0.3 10*3/uL (ref 0–0.7)
Eosinophils Relative: 4 %
HEMATOCRIT: 45.7 % (ref 35.0–47.0)
HEMOGLOBIN: 15.8 g/dL (ref 12.0–16.0)
LYMPHS ABS: 2.8 10*3/uL (ref 1.0–3.6)
LYMPHS PCT: 32 %
MCH: 31 pg (ref 26.0–34.0)
MCHC: 34.7 g/dL (ref 32.0–36.0)
MCV: 89.2 fL (ref 80.0–100.0)
MONO ABS: 0.7 10*3/uL (ref 0.2–0.9)
MONOS PCT: 8 %
NEUTROS ABS: 4.9 10*3/uL (ref 1.4–6.5)
NEUTROS PCT: 55 %
Platelets: 400 10*3/uL (ref 150–440)
RBC: 5.12 MIL/uL (ref 3.80–5.20)
RDW: 12.2 % (ref 11.5–14.5)
WBC: 8.7 10*3/uL (ref 3.6–11.0)

## 2015-10-28 LAB — POCT PREGNANCY, URINE: Preg Test, Ur: NEGATIVE

## 2015-10-28 LAB — COMPREHENSIVE METABOLIC PANEL
ALK PHOS: 103 U/L (ref 38–126)
ALT: 38 U/L (ref 14–54)
ANION GAP: 6 (ref 5–15)
AST: 24 U/L (ref 15–41)
Albumin: 4.6 g/dL (ref 3.5–5.0)
BILIRUBIN TOTAL: 0.8 mg/dL (ref 0.3–1.2)
BUN: 9 mg/dL (ref 6–20)
CALCIUM: 9.5 mg/dL (ref 8.9–10.3)
CO2: 23 mmol/L (ref 22–32)
Chloride: 109 mmol/L (ref 101–111)
Creatinine, Ser: 0.55 mg/dL (ref 0.44–1.00)
GFR calc Af Amer: >60 mL/min (ref >60)
Glucose, Bld: 109 mg/dL — ABNORMAL HIGH (ref 65–99)
POTASSIUM: 3.9 mmol/L (ref 3.5–5.1)
Sodium: 138 mmol/L (ref 135–145)
TOTAL PROTEIN: 8.3 g/dL — AB (ref 6.5–8.1)

## 2015-10-28 LAB — MAGNESIUM: MAGNESIUM: 2 mg/dL (ref 1.7–2.4)

## 2015-10-28 MED ORDER — DIPHENHYDRAMINE HCL 50 MG/ML IJ SOLN
25.0000 mg | Freq: Once | INTRAMUSCULAR | Status: AC
  Administered 2015-10-28: 25 mg via INTRAVENOUS
  Filled 2015-10-28: qty 1

## 2015-10-28 MED ORDER — TRAMADOL HCL 50 MG PO TABS: 50.0000 mg | ORAL_TABLET | Freq: Four times a day (QID) | ORAL | 0 refills | Status: DC | PRN

## 2015-10-28 MED ORDER — LEVETIRACETAM 500 MG/5ML IV SOLN
1000.0000 mg | Freq: Once | INTRAVENOUS | Status: AC
  Administered 2015-10-28: 1000 mg via INTRAVENOUS
  Filled 2015-10-28: qty 10

## 2015-10-28 MED ORDER — SODIUM CHLORIDE 0.9 % IV BOLUS (SEPSIS)
1000.0000 mL | Freq: Once | INTRAVENOUS | Status: AC
  Administered 2015-10-28: 1000 mL via INTRAVENOUS

## 2015-10-28 MED ORDER — KETOROLAC TROMETHAMINE 30 MG/ML IJ SOLN
15.0000 mg | Freq: Once | INTRAMUSCULAR | Status: AC
  Administered 2015-10-28: 15 mg via INTRAVENOUS
  Filled 2015-10-28: qty 1

## 2015-10-28 MED ORDER — METOCLOPRAMIDE HCL 5 MG/ML IJ SOLN
10.0000 mg | Freq: Once | INTRAMUSCULAR | Status: AC
  Administered 2015-10-28: 10 mg via INTRAVENOUS
  Filled 2015-10-28: qty 2

## 2015-10-28 MED ORDER — LEVETIRACETAM 500 MG PO TABS: 500.0000 mg | ORAL_TABLET | Freq: Two times a day (BID) | ORAL | 0 refills | Status: DC

## 2015-10-28 MED ORDER — METOCLOPRAMIDE HCL 10 MG PO TABS: 10.0000 mg | ORAL_TABLET | Freq: Three times a day (TID) | ORAL | 1 refills | Status: DC | PRN

## 2015-10-28 NOTE — ED Provider Notes (Signed)
Time Seen: Approximately 1459 I have reviewed the triage notes  Chief Complaint: Migraine and Seizures   History of Present Illness: Diane Guerrero is a 20 y.o. female who presents with a history of partial complex seizure disorder. She states she's had a series of seizures since last evening. She states this is not terribly unusual and had same issue back and you have. There was a discussion at that time of starting her on Keppra. The patient states she's been on Topamax and has taken it as prescribed. She states that occasionally she will get a headache with her seizures and had a similar presentation with headaches and seizures 3 years ago. She denies any fever or head trauma. She denies any neck pain. She denies any focal weakness in either upper or lower extremities. The patient denies any persistent nausea and vomiting. She states this headache feels similar to previous migraine headaches in the past.   Past Medical History:  Diagnosis Date  . Asthma   . Complex partial seizure ([place])   . Hyperglycemia   . Obesity   . POTS (postural orthostatic tachycardia syndrome)   . Seasonal allergies   . Syncope and collapse   . Thrombocytosis [facility])     Patient Active Problem List   Diagnosis Date Noted  . Rubella immune status not known 02/16/2015  . Immunity status testing 02/16/2015  . Screening-pulmonary TB 02/16/2015  . General counseling and advice on contraceptive management 02/14/2015  . Late period 02/14/2015  . Asthma   . Complex partial seizure ([place])   . Obesity   . POTS (postural orthostatic tachycardia syndrome)   . Thrombocytosis ([place])   . Hyperglycemia   . Headache 06/23/2013  . Syncope 05/26/2013  . Asthma 04/27/2013  . Allergic rhinitis 04/27/2013    Past Surgical History:  Procedure Laterality Date  . LINGUAL FRENECTOMY  1996  . MYRINGOTOMY Bilateral 1996  . TYMPANOSTOMY TUBE PLACEMENT      Past Surgical History:  Procedure Laterality Date  . LINGUAL  FRENECTOMY  1996  . MYRINGOTOMY Bilateral 1996  . TYMPANOSTOMY TUBE PLACEMENT      Current Outpatient Rx  . Order #: [id number] Class: Print  . Order #: [id number] Class: Historical Med  . Order #: [id number] Class: Historical Med  . Order #: [id number] Class: Historical Med  . Order #: [id number] Class: Normal  . Order #: [id number] Class: Historical Med    Allergies:  Peanut oil and Suprax [cefixime]  Family History: Family History  Problem Relation Age of Onset  . Allergies Mother   . Asthma Mother   . Heart disease Mother   . Rheum arthritis Mother   . Lupus Mother   . Stroke Mother   . Emphysema Maternal Grandmother   . Allergies Maternal Grandmother   . Cancer Maternal Grandmother     bladder  . COPD Paternal Uncle   . Heart disease Maternal Grandfather   . Cancer Maternal Grandfather     lung  . Lung disease Maternal Grandfather   . Cancer Paternal Grandmother     breast  . Diabetes Paternal Grandmother     Social History: Social History  Substance Use Topics  . Smoking status: Never Smoker  . Smokeless tobacco: Never Used  . Alcohol use No     Review of Systems:   10 point review of systems was performed and was otherwise negative:  Constitutional: No fever Eyes: No visual disturbances ENT: No sore throat, ear pain Cardiac: No chest pain Respiratory: No shortness of  breath, wheezing, or stridor Abdomen: No abdominal pain, no vomiting, No diarrhea Endocrine: No weight loss, No night sweats Extremities: No peripheral edema, cyanosis Skin: No rashes, easy bruising Neurologic: No focal weakness, trouble with speech or swollowing Urologic: No dysuria, Hematuria, or urinary frequency Patient is not better time, urinated, defecated  Physical Exam:  ED Triage Vitals [10/28/15 1427]  Enc Vitals Group     BP (!) 146/82     Pulse Rate 99     Resp 18     Temp 97.7 F (36.5 C)     Temp Source Oral     SpO2 98 %     Weight 240 lb (108.9 kg)     Height 5\' 8"   (1.727 m)     Head Circumference      Peak Flow      Pain Score 10     Pain Loc      Pain Edu?      Excl. in [place]?     General: Awake , Alert , and Oriented times 3; GCS 15 Head: Normal cephalic , atraumatic Eyes: Pupils equal , round, reactive to light Nose/Throat: No nasal drainage, patent upper airway without erythema or exudate.  Neck: Supple, Full range of motion, No anterior adenopathy or palpable thyroid masses Lungs: Clear to ascultation without wheezes , rhonchi, or rales Heart: Regular rate, regular rhythm without murmurs , gallops , or rubs Abdomen: Soft, non tender without rebound, guarding , or rigidity; bowel sounds positive and symmetric in all 4 quadrants. No organomegaly .        Extremities: 2 plus symmetric pulses. No edema, clubbing or cyanosis Neurologic: normal ambulation, Motor symmetric without deficits, sensory intact Skin: warm, dry, no rashes   Labs:   All laboratory work was reviewed including any pertinent negatives or positives listed below:  Labs Reviewed  COMPREHENSIVE METABOLIC PANEL - Abnormal; Notable for the following:       Result Value   Glucose, Bld 109 (*)    Total Protein 8.3 (*)    All other components within normal limits  CBC WITH DIFFERENTIAL/PLATELET  MAGNESIUM  POC URINE PREG, ED  POCT PREGNANCY, URINE  Laboratory work was reviewed and showed no clinically significant abnormalities.   Radiology:  "Ct Head Wo Contrast  Result Date: 10/28/2015 CLINICAL DATA:  Seizures with headache. EXAM: CT HEAD WITHOUT CONTRAST TECHNIQUE: Contiguous axial images were obtained from the base of the skull through the vertex without intravenous contrast. COMPARISON:  MRI August 02, 2013 FINDINGS: Brain: No evidence of acute infarction, hemorrhage, hydrocephalus, extra-axial collection or mass lesion/mass effect. Vascular: No hyperdense vessel or unexpected calcification. Skull: Normal. Negative for fracture or focal lesion. Sinuses/Orbits: No acute  finding. Other:  No other abnormalities. IMPRESSION: No acute abnormalities. Electronically Signed   By: Dorise Bullion III M.D   On: 10/28/2015 16:34  "  I personally reviewed the radiologic studies     ED Course: Patient had symptomatic improvement with an IV fluid bolus, Reglan, Toradol, Benadryl. The patient states her headache is resolved at this point she had no further seizure activity here in emergency department. Because of been some discussion with her neurologist about starting on Her I felt we would frontload her with a gram of Keppra here and started on a low dose of 500 mg twice a day. I felt the patient clinically did not require lumbar puncture spinal tap and evaluation of her headache and her head CT was mainly performed because of the  associated seizure activity. He certainly sounds of a migraine equivalent headache. The patient denies any focal abnormalities and overall state symptomatic improvement. Clinical Course     Assessment: 'Complex partial seizure disorder Acute exacerbation of chronic migraine cephalgia     Plan:  The patient was advised to contact her neurologist for further outpatient treatment and evaluation " New Prescriptions   LEVETIRACETAM (KEPPRA) 500 MG TABLET    Take 1 tablet (500 mg total) by mouth 2 (two) times daily.   METOCLOPRAMIDE (REGLAN) 10 MG TABLET    Take 1 tablet (10 mg total) by mouth every 8 (eight) hours as needed for nausea.   TRAMADOL (ULTRAM) 50 MG TABLET    Take 1 tablet (50 mg total) by mouth every 6 (six) hours as needed.  " Patient was advised to return immediately if condition worsens. Patient was advised to follow up with their primary care physician or other specialized physicians involved in their outpatient care. The patient and/or family member/power of attorney had laboratory results reviewed at the bedside. All questions and concerns were addressed and appropriate discharge instructions were distributed by the nursing  staff.             Daymon Larsen, MD 10/28/15 [phone]

## 2015-10-28 NOTE — Discharge Instructions (Signed)
Please return immediately if condition worsens. Please contact her primary physician or the physician you were given for referral. If you have any specialist physicians involved in her treatment and plan please also contact them. Thank you for using [facility] [facility] emergency [facility]. ° °

## 2015-10-28 NOTE — ED Triage Notes (Signed)
Pt states she has been having seizures today and last night and headache for 2 wks that was controlled with ibuprofen but today it is worse. Takes topamax XR and states she has been taking it as normal. Hx migraines and seizures, feels like this is a migraine.

## 2015-10-28 NOTE — ED Triage Notes (Signed)
C/o nausea, light sensitivity, and lightheadedness.

## 2015-11-22 DIAGNOSIS — R197 Diarrhea, unspecified: Secondary | ICD-10-CM | POA: Diagnosis not present

## 2015-11-22 DIAGNOSIS — R112 Nausea with vomiting, unspecified: Secondary | ICD-10-CM | POA: Diagnosis not present

## 2015-11-30 DIAGNOSIS — G932 Benign intracranial hypertension: Secondary | ICD-10-CM | POA: Diagnosis not present

## 2015-11-30 DIAGNOSIS — Z8669 Personal history of other diseases of the nervous system and sense organs: Secondary | ICD-10-CM | POA: Diagnosis not present

## 2015-12-07 DIAGNOSIS — G932 Benign intracranial hypertension: Secondary | ICD-10-CM | POA: Diagnosis not present

## 2015-12-10 DIAGNOSIS — G971 Other reaction to spinal and lumbar puncture: Secondary | ICD-10-CM | POA: Diagnosis not present

## 2015-12-10 DIAGNOSIS — R112 Nausea with vomiting, unspecified: Secondary | ICD-10-CM | POA: Diagnosis not present

## 2015-12-10 DIAGNOSIS — R51 Headache: Secondary | ICD-10-CM | POA: Diagnosis not present

## 2015-12-10 DIAGNOSIS — Z5181 Encounter for therapeutic drug level monitoring: Secondary | ICD-10-CM | POA: Diagnosis not present

## 2015-12-11 DIAGNOSIS — G971 Other reaction to spinal and lumbar puncture: Secondary | ICD-10-CM | POA: Diagnosis not present

## 2015-12-11 DIAGNOSIS — Z5181 Encounter for therapeutic drug level monitoring: Secondary | ICD-10-CM | POA: Diagnosis not present

## 2015-12-11 DIAGNOSIS — R51 Headache: Secondary | ICD-10-CM | POA: Diagnosis not present

## 2015-12-11 DIAGNOSIS — R112 Nausea with vomiting, unspecified: Secondary | ICD-10-CM | POA: Diagnosis not present

## 2015-12-15 DIAGNOSIS — R51 Headache: Secondary | ICD-10-CM | POA: Diagnosis not present

## 2015-12-15 DIAGNOSIS — Z8669 Personal history of other diseases of the nervous system and sense organs: Secondary | ICD-10-CM | POA: Diagnosis not present

## 2015-12-15 DIAGNOSIS — G932 Benign intracranial hypertension: Secondary | ICD-10-CM | POA: Diagnosis not present

## 2015-12-17 DIAGNOSIS — M9901 Segmental and somatic dysfunction of cervical region: Secondary | ICD-10-CM | POA: Diagnosis not present

## 2015-12-17 DIAGNOSIS — M531 Cervicobrachial syndrome: Secondary | ICD-10-CM | POA: Diagnosis not present

## 2015-12-17 DIAGNOSIS — M791 Myalgia: Secondary | ICD-10-CM | POA: Diagnosis not present

## 2015-12-17 DIAGNOSIS — M6080 Other myositis, unspecified site: Secondary | ICD-10-CM | POA: Diagnosis not present

## 2015-12-17 DIAGNOSIS — M9903 Segmental and somatic dysfunction of lumbar region: Secondary | ICD-10-CM | POA: Diagnosis not present

## 2015-12-17 DIAGNOSIS — M9902 Segmental and somatic dysfunction of thoracic region: Secondary | ICD-10-CM | POA: Diagnosis not present

## 2015-12-17 DIAGNOSIS — M62830 Muscle spasm of back: Secondary | ICD-10-CM | POA: Diagnosis not present

## 2015-12-20 DIAGNOSIS — R4689 Other symptoms and signs involving appearance and behavior: Secondary | ICD-10-CM | POA: Diagnosis not present

## 2015-12-20 DIAGNOSIS — Z8669 Personal history of other diseases of the nervous system and sense organs: Secondary | ICD-10-CM | POA: Diagnosis not present

## 2015-12-21 DIAGNOSIS — R4689 Other symptoms and signs involving appearance and behavior: Secondary | ICD-10-CM | POA: Diagnosis not present

## 2015-12-21 DIAGNOSIS — Z8669 Personal history of other diseases of the nervous system and sense organs: Secondary | ICD-10-CM | POA: Diagnosis not present

## 2015-12-28 DIAGNOSIS — G43109 Migraine with aura, not intractable, without status migrainosus: Secondary | ICD-10-CM | POA: Diagnosis not present

## 2015-12-28 DIAGNOSIS — R569 Unspecified convulsions: Secondary | ICD-10-CM | POA: Diagnosis not present

## 2016-02-14 ENCOUNTER — Ambulatory Visit: Payer: BLUE CROSS/BLUE SHIELD | Admitting: Obstetrics and Gynecology

## 2016-02-27 ENCOUNTER — Ambulatory Visit: Payer: BLUE CROSS/BLUE SHIELD | Admitting: Obstetrics and Gynecology

## 2016-03-28 DIAGNOSIS — G43009 Migraine without aura, not intractable, without status migrainosus: Secondary | ICD-10-CM | POA: Diagnosis not present

## 2016-03-28 DIAGNOSIS — G43109 Migraine with aura, not intractable, without status migrainosus: Secondary | ICD-10-CM | POA: Diagnosis not present

## 2016-03-29 DIAGNOSIS — H5213 Myopia, bilateral: Secondary | ICD-10-CM | POA: Diagnosis not present

## 2016-04-11 ENCOUNTER — Ambulatory Visit (INDEPENDENT_AMBULATORY_CARE_PROVIDER_SITE_OTHER): Payer: 59 | Admitting: Obstetrics and Gynecology

## 2016-04-11 ENCOUNTER — Encounter: Payer: Self-pay | Admitting: Obstetrics and Gynecology

## 2016-04-11 VITALS — BP 129/82 | HR 112 | Ht 68.0 in | Wt 250.5 lb

## 2016-04-11 DIAGNOSIS — Z01411 Encounter for gynecological examination (general) (routine) with abnormal findings: Secondary | ICD-10-CM | POA: Diagnosis not present

## 2016-04-11 DIAGNOSIS — Z30431 Encounter for routine checking of intrauterine contraceptive device: Secondary | ICD-10-CM

## 2016-04-11 DIAGNOSIS — N9089 Other specified noninflammatory disorders of vulva and perineum: Secondary | ICD-10-CM | POA: Diagnosis not present

## 2016-04-11 DIAGNOSIS — E669 Obesity, unspecified: Secondary | ICD-10-CM | POA: Diagnosis not present

## 2016-04-11 MED ORDER — CLOBETASOL PROPIONATE 0.05 % EX OINT: 1.0000 "application " | TOPICAL_OINTMENT | Freq: Two times a day (BID) | CUTANEOUS | 2 refills | Status: DC

## 2016-04-11 NOTE — Progress Notes (Signed)
  Subjective:     Diane Guerrero is a 21 y.o. female and is here for a comprehensive physical exam. The patient reports problems - weight gain, eating late at night due to work.. 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 Exercising 3 days a week. Married caucasain.  Social History   Social History  . Marital status: Significant Other    Spouse name: N/A  . Number of children: N/A  . Years of education: N/A   Occupational History  . Not on file.   Social History Main Topics  . Smoking status: Never Smoker  . Smokeless tobacco: Never Used  . Alcohol use No  . Drug use: No  . Sexual activity: Yes    Birth control/ protection: IUD     Comment: kyleena   Other Topics Concern  . Not on file   Social History Narrative  . No narrative on file   Health Maintenance  Topic Date Due  . INFLUENZA VACCINE  08/21/2015  . PAP SMEAR  04/11/2018  . TETANUS/TDAP  06/17/2023  . HIV Screening  Addressed    The following portions of the patient's history were reviewed and updated as appropriate: allergies, current medications, past family history, past medical history, past social history, past surgical history and problem list.  Review of Systems A comprehensive review of systems was negative.   Objective:    General appearance: alert, cooperative and appears stated age Neck: no adenopathy, no carotid bruit, no JVD, supple, symmetrical, trachea midline and thyroid not enlarged, symmetric, no tenderness/mass/nodules Lungs: clear to auscultation bilaterally Breasts: normal appearance, no masses or tenderness Heart: regular rate and rhythm, S1, S2 normal, no murmur, click, rub or gallop Abdomen: soft, non-tender; bowel sounds normal; no masses,  no organomegaly Pelvic: cervix normal in appearance, external genitalia normal, no adnexal masses or tenderness, no cervical motion tenderness, rectovaginal septum normal, uterus normal size, shape, and consistency, vagina normal without discharge and large  skin tag on inner left thigh near groin, gets irritated ith shaving and chaffed at times    Assessment:    Healthy female exam. Obesity, IUD check, dysparenia, skin tag removal     Plan:  Instructed to continue estrace cream to lower perineum qod and add clobex ointment qod prn. Large skin tag removed without difficulty Encouraged weight loss. RTC 1 year or as needed.  Fitzgerald Dunne Gayla Medicus, CNM   See After Visit Summary for Counseling Recommendations

## 2016-12-26 ENCOUNTER — Telehealth: Payer: Self-pay | Admitting: Family Medicine

## 2016-12-26 NOTE — Telephone Encounter (Signed)
Copied from [place] [id number]. Topic: Quick Communication - See Telephone Encounter >> Dec 26, 2016 10:27 AM Cleaster Corin, NT wrote: CRM for notification. See Telephone encounter for:   12/26/16. [employer] called again about medical records request

## 2017-01-16 NOTE — Telephone Encounter (Signed)
Please see media tab in chart review.  The records request was processed by Mariane Masters w/[employer] on 01/02/17 & 01/08/17.  An invoice has been sent to [facility] (on behalf of [place]) on the above dates.  Once patient is received then records will be released by [employer] (our medical record processing company).

## 2017-03-02 ENCOUNTER — Encounter: Payer: Self-pay | Admitting: Certified Nurse Midwife

## 2017-04-14 ENCOUNTER — Encounter: Payer: 59 | Admitting: Obstetrics and Gynecology

## 2017-07-21 ENCOUNTER — Encounter: Payer: Self-pay | Admitting: Obstetrics and Gynecology

## 2017-07-21 ENCOUNTER — Ambulatory Visit: Payer: No Typology Code available for payment source | Admitting: Obstetrics and Gynecology

## 2017-07-21 VITALS — BP 121/84 | HR 83 | Wt 252.1 lb

## 2017-07-21 DIAGNOSIS — Z30432 Encounter for removal of intrauterine contraceptive device: Secondary | ICD-10-CM | POA: Diagnosis not present

## 2017-07-21 MED ORDER — NORETHIN ACE-ETH ESTRAD-FE 1-20 MG-MCG PO TABS: 1.0000 | ORAL_TABLET | Freq: Every day | ORAL | 4 refills | Status: DC

## 2017-07-21 NOTE — Progress Notes (Signed)
Diane Guerrero is a 21 y.o. year old [id number] Caucasian female who presents for removal of a Mirena IUD. Her Mirena IUD was placed 2015. She has been amenorrhea since insertion, but desires a pregnancy in the near future and wants to switch back to OCPs in leu of that. Denies any other concerns, is past due for AE,   No LMP recorded. (Menstrual status: IUD). BP 121/84   Pulse 83   Wt 252 lb 1.6 oz (114.4 kg)   BMI 38.33 kg/m   Time out was performed.  A pederson speculum was placed in the vagina.  The cervix was visualized, and the strings were visible. They were grasped and the Mirena was easily removed intact without complications.   F/U 4-6 weeks for AE Switch to Junel and encouraged to use back up method for the first month.  Juanjesus Pepperman Rockney Ghee, CNM

## 2017-08-28 ENCOUNTER — Encounter: Payer: Self-pay | Admitting: Obstetrics and Gynecology

## 2017-08-28 ENCOUNTER — Ambulatory Visit (INDEPENDENT_AMBULATORY_CARE_PROVIDER_SITE_OTHER): Payer: No Typology Code available for payment source | Admitting: Obstetrics and Gynecology

## 2017-08-28 VITALS — BP 116/79 | HR 92 | Ht 69.0 in | Wt 244.4 lb

## 2017-08-28 DIAGNOSIS — Z01419 Encounter for gynecological examination (general) (routine) without abnormal findings: Secondary | ICD-10-CM

## 2017-08-28 NOTE — Progress Notes (Signed)
  Subjective:     Diane Guerrero is a married white 22 y.o. female and is here for a comprehensive physical exam. Working FT at [employer]. Is sexually active with recent switch to OCPs for contraception. The patient reports no problems.  Social History   Socioeconomic History  . Marital status: Married    Spouse name: Not on file  . Number of children: Not on file  . Years of education: Not on file  . Highest education level: Not on file  Occupational History  . Not on file  Social Needs  . Financial resource strain: Not on file  . Food insecurity:    Worry: Not on file    Inability: Not on file  . Transportation needs:    Medical: Not on file    Non-medical: Not on file  Tobacco Use  . Smoking status: Never Smoker  . Smokeless tobacco: Never Used  Substance and Sexual Activity  . Alcohol use: No  . Drug use: No  . Sexual activity: Yes    Birth control/protection: Pill    Comment: kyleena  Lifestyle  . Physical activity:    Days per week: Not on file    Minutes per session: Not on file  . Stress: Not on file  Relationships  . Social connections:    Talks on phone: Not on file    Gets together: Not on file    Attends religious service: Not on file    Active member of club or organization: Not on file    Attends meetings of clubs or organizations: Not on file    Relationship status: Not on file  . Intimate partner violence:    Fear of current or ex partner: Not on file    Emotionally abused: Not on file    Physically abused: Not on file    Forced sexual activity: Not on file  Other Topics Concern  . Not on file  Social History Narrative  . Not on file   Health Maintenance  Topic Date Due  . INFLUENZA VACCINE  08/20/2017  . PAP SMEAR  04/11/2018  . TETANUS/TDAP  06/17/2023  . HIV Screening  Addressed    The following portions of the patient's history were reviewed and updated as appropriate: allergies, current medications, past family history, past medical  history, past social history, past surgical history and problem list.  Review of Systems Pertinent items noted in HPI and remainder of comprehensive ROS otherwise negative.   Objective:    General appearance: alert, cooperative and appears stated age Neck: no adenopathy, no carotid bruit, no JVD, supple, symmetrical, trachea midline and thyroid not enlarged, symmetric, no tenderness/mass/nodules Lungs: clear to auscultation bilaterally Breasts: normal appearance, no masses or tenderness Heart: regular rate and rhythm, S1, S2 normal, no murmur, click, rub or gallop Abdomen: soft, non-tender; bowel sounds normal; no masses,  no organomegaly Pelvic: cervix normal in appearance, external genitalia normal, no adnexal masses or tenderness, no cervical motion tenderness, rectovaginal septum normal, uterus normal size, shape, and consistency and vagina normal without discharge    Assessment:    Healthy female exam. obesity     Plan:  RTC 1 year or as needed.   See After Visit Summary for Counseling Recommendations

## 2017-08-28 NOTE — Patient Instructions (Signed)
Preventive Care 18-39 Years, Female Preventive care refers to lifestyle choices and visits with your health care provider that can promote health and wellness. What does preventive care include?  A yearly physical exam. This is also called an annual well check.  Dental exams once or twice a year.  Routine eye exams. Ask your health care provider how often you should have your eyes checked.  Personal lifestyle choices, including: ? Daily care of your teeth and gums. ? Regular physical activity. ? Eating a healthy diet. ? Avoiding tobacco and drug use. ? Limiting alcohol use. ? Practicing safe sex. ? Taking vitamin and mineral supplements as recommended by your health care provider. What happens during an annual well check? The services and screenings done by your health care provider during your annual well check will depend on your age, overall health, lifestyle risk factors, and family history of disease. Counseling Your health care provider may ask you questions about your:  Alcohol use.  Tobacco use.  Drug use.  Emotional well-being.  Home and relationship well-being.  Sexual activity.  Eating habits.  Work and work Statistician.  Method of birth control.  Menstrual cycle.  Pregnancy history.  Screening You may have the following tests or measurements:  Height, weight, and BMI.  Diabetes screening. This is done by checking your blood sugar (glucose) after you have not eaten for a while (fasting).  Blood pressure.  Lipid and cholesterol levels. These may be checked every 5 years starting at age 38.  Skin check.  Hepatitis C blood test.  Hepatitis B blood test.  Sexually transmitted disease (STD) testing.  BRCA-related cancer screening. This may be done if you have a family history of breast, ovarian, tubal, or peritoneal cancers.  Pelvic exam and Pap test. This may be done every 3 years starting at age 38. Starting at age 30, this may be done  every 5 years if you have a Pap test in combination with an HPV test.  Discuss your test results, treatment options, and if necessary, the need for more tests with your health care provider. Vaccines Your health care provider may recommend certain vaccines, such as:  Influenza vaccine. This is recommended every year.  Tetanus, diphtheria, and acellular pertussis (Tdap, Td) vaccine. You may need a Td booster every 10 years.  Varicella vaccine. You may need this if you have not been vaccinated.  HPV vaccine. If you are 39 or younger, you may need three doses over 6 months.  Measles, mumps, and rubella (MMR) vaccine. You may need at least one dose of MMR. You may also need a second dose.  Pneumococcal 13-valent conjugate (PCV13) vaccine. You may need this if you have certain conditions and were not previously vaccinated.  Pneumococcal polysaccharide (PPSV23) vaccine. You may need one or two doses if you smoke cigarettes or if you have certain conditions.  Meningococcal vaccine. One dose is recommended if you are age 68-21 years and a first-year college student living in a residence hall, or if you have one of several medical conditions. You may also need additional booster doses.  Hepatitis A vaccine. You may need this if you have certain conditions or if you travel or work in places where you may be exposed to hepatitis A.  Hepatitis B vaccine. You may need this if you have certain conditions or if you travel or work in places where you may be exposed to hepatitis B.  Haemophilus influenzae type b (Hib) vaccine. You may need this  if you have certain risk factors.  Talk to your health care provider about which screenings and vaccines you need and how often you need them. This information is not intended to replace advice given to you by your health care provider. Make sure you discuss any questions you have with your health care provider. Document Released: 03/04/2001 Document Revised:  09/26/2015 Document Reviewed: 11/07/2014 Elsevier Interactive Patient Education  2018 Elsevier Inc.  

## 2017-08-29 LAB — COMPREHENSIVE METABOLIC PANEL
A/G RATIO: 1.7 (ref 1.2–2.2)
ALT: 33 IU/L — AB (ref 0–32)
AST: 19 IU/L (ref 0–40)
Albumin: 4.3 g/dL (ref 3.5–5.5)
Alkaline Phosphatase: 97 IU/L (ref 39–117)
BILIRUBIN TOTAL: 0.6 mg/dL (ref 0.0–1.2)
BUN/Creatinine Ratio: 16 (ref 9–23)
BUN: 9 mg/dL (ref 6–20)
CHLORIDE: 106 mmol/L (ref 96–106)
CO2: 20 mmol/L (ref 20–29)
Calcium: 8.8 mg/dL (ref 8.7–10.2)
Creatinine, Ser: 0.58 mg/dL (ref 0.57–1.00)
GFR calc Af Amer: 150 mL/min/{1.73_m2} (ref >59)
GFR calc non Af Amer: 130 mL/min/{1.73_m2} (ref >59)
GLUCOSE: 89 mg/dL (ref 65–99)
Globulin, Total: 2.5 g/dL (ref 1.5–4.5)
POTASSIUM: 3.8 mmol/L (ref 3.5–5.2)
Sodium: 141 mmol/L (ref 134–144)
TOTAL PROTEIN: 6.8 g/dL (ref 6.0–8.5)

## 2017-08-29 LAB — LIPID PANEL
CHOL/HDL RATIO: 2.9 ratio (ref 0.0–4.4)
Cholesterol, Total: 149 mg/dL (ref 100–199)
HDL: 51 mg/dL (ref >39)
LDL CALC: 72 mg/dL (ref 0–99)
TRIGLYCERIDES: 128 mg/dL (ref 0–149)
VLDL Cholesterol Cal: 26 mg/dL (ref 5–40)

## 2017-08-29 LAB — CBC
HEMATOCRIT: 38.6 % (ref 34.0–46.6)
Hemoglobin: 13.3 g/dL (ref 11.1–15.9)
MCH: 31.1 pg (ref 26.6–33.0)
MCHC: 34.5 g/dL (ref 31.5–35.7)
MCV: 90 fL (ref 79–97)
Platelets: 391 10*3/uL (ref 150–450)
RBC: 4.27 x10E6/uL (ref 3.77–5.28)
RDW: 12.8 % (ref 12.3–15.4)
WBC: 9.9 10*3/uL (ref 3.4–10.8)

## 2017-08-29 LAB — HEMOGLOBIN A1C
ESTIMATED AVERAGE GLUCOSE: 100 mg/dL
HEMOGLOBIN A1C: 5.1 % (ref 4.8–5.6)

## 2017-09-01 LAB — CYTOLOGY - PAP

## 2018-09-02 ENCOUNTER — Encounter: Payer: No Typology Code available for payment source | Admitting: Obstetrics and Gynecology

## 2018-09-03 ENCOUNTER — Encounter: Payer: No Typology Code available for payment source | Admitting: Obstetrics and Gynecology

## 2019-04-19 ENCOUNTER — Encounter: Payer: No Typology Code available for payment source | Admitting: Certified Nurse Midwife

## 2019-06-02 ENCOUNTER — Encounter: Payer: No Typology Code available for payment source | Admitting: Certified Nurse Midwife

## 2019-06-06 ENCOUNTER — Encounter: Payer: No Typology Code available for payment source | Admitting: Certified Nurse Midwife

## 2020-01-30 ENCOUNTER — Ambulatory Visit: Payer: Self-pay

## 2020-08-03 LAB — HEPATITIS C ANTIBODY: HCV Ab: NEGATIVE

## 2020-08-03 LAB — OB RESULTS CONSOLE RPR: RPR: NONREACTIVE

## 2020-08-03 LAB — OB RESULTS CONSOLE ABO/RH: RH Type: NEGATIVE

## 2020-08-03 LAB — OB RESULTS CONSOLE HIV ANTIBODY (ROUTINE TESTING): HIV: NONREACTIVE

## 2020-08-03 LAB — OB RESULTS CONSOLE HEPATITIS B SURFACE ANTIGEN: Hepatitis B Surface Ag: NEGATIVE

## 2020-08-03 LAB — OB RESULTS CONSOLE RUBELLA ANTIBODY, IGM: Rubella: IMMUNE

## 2020-08-03 LAB — OB RESULTS CONSOLE VARICELLA ZOSTER ANTIBODY, IGG: Varicella: IMMUNE

## 2020-08-17 LAB — OB RESULTS CONSOLE GC/CHLAMYDIA
Chlamydia: NEGATIVE
Gonorrhea: NEGATIVE

## 2020-11-16 ENCOUNTER — Other Ambulatory Visit: Payer: Self-pay | Admitting: Obstetrics and Gynecology

## 2020-11-16 DIAGNOSIS — Z363 Encounter for antenatal screening for malformations: Secondary | ICD-10-CM

## 2020-11-26 ENCOUNTER — Ambulatory Visit: Payer: Self-pay

## 2020-11-28 ENCOUNTER — Encounter: Payer: Self-pay | Admitting: *Deleted

## 2020-12-03 ENCOUNTER — Other Ambulatory Visit: Payer: Self-pay

## 2020-12-03 ENCOUNTER — Ambulatory Visit (HOSPITAL_BASED_OUTPATIENT_CLINIC_OR_DEPARTMENT_OTHER): Payer: PRIVATE HEALTH INSURANCE

## 2020-12-03 ENCOUNTER — Encounter: Payer: Self-pay | Admitting: Obstetrics and Gynecology

## 2020-12-03 ENCOUNTER — Ambulatory Visit: Payer: Self-pay | Attending: Obstetrics and Gynecology | Admitting: *Deleted

## 2020-12-03 ENCOUNTER — Encounter: Payer: Self-pay | Admitting: *Deleted

## 2020-12-03 DIAGNOSIS — F419 Anxiety disorder, unspecified: Secondary | ICD-10-CM | POA: Diagnosis not present

## 2020-12-03 DIAGNOSIS — O99342 Other mental disorders complicating pregnancy, second trimester: Secondary | ICD-10-CM | POA: Insufficient documentation

## 2020-12-03 DIAGNOSIS — E669 Obesity, unspecified: Secondary | ICD-10-CM

## 2020-12-03 DIAGNOSIS — G40909 Epilepsy, unspecified, not intractable, without status epilepticus: Secondary | ICD-10-CM | POA: Diagnosis not present

## 2020-12-03 DIAGNOSIS — O283 Abnormal ultrasonic finding on antenatal screening of mother: Secondary | ICD-10-CM | POA: Diagnosis not present

## 2020-12-03 DIAGNOSIS — O99212 Obesity complicating pregnancy, second trimester: Secondary | ICD-10-CM | POA: Insufficient documentation

## 2020-12-03 DIAGNOSIS — Z3689 Encounter for other specified antenatal screening: Secondary | ICD-10-CM

## 2020-12-03 DIAGNOSIS — Z363 Encounter for antenatal screening for malformations: Secondary | ICD-10-CM

## 2020-12-03 DIAGNOSIS — Z3A26 26 weeks gestation of pregnancy: Secondary | ICD-10-CM

## 2020-12-03 DIAGNOSIS — O99352 Diseases of the nervous system complicating pregnancy, second trimester: Secondary | ICD-10-CM | POA: Diagnosis not present

## 2020-12-03 IMAGING — US US MFM OB DETAIL+14 WK
1 series · 13 of 28 positions shown · non-contrast
Comparison: none

[Series 1: us mfm ob detail+14 wk · 13 of 96 slices shown]
[im 4/96]
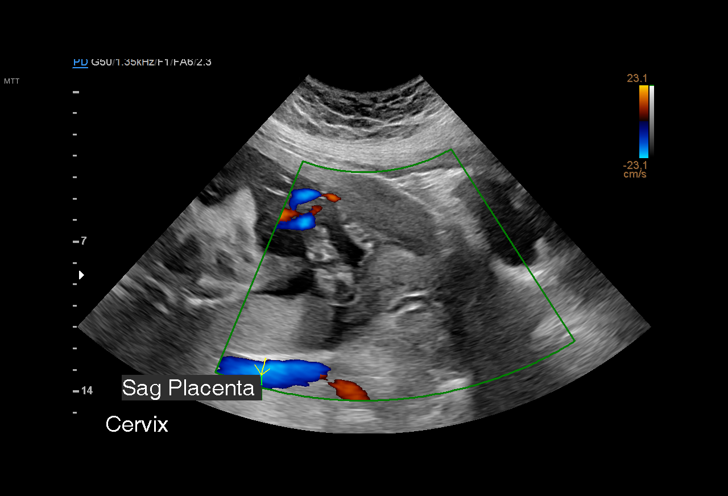
[im 11/96]
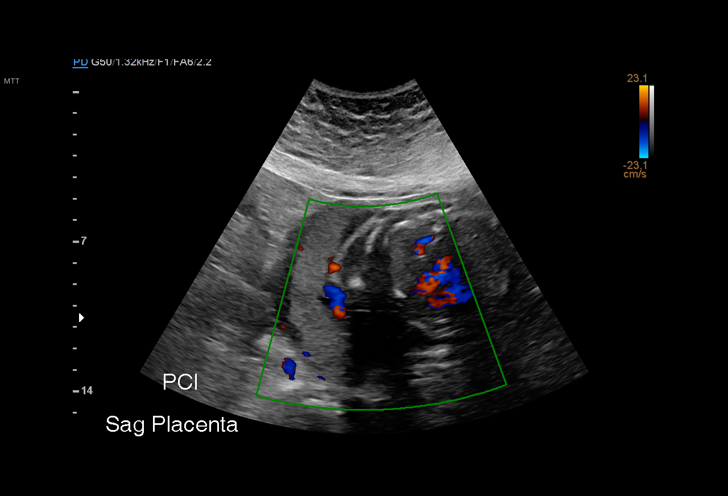
[im 18/96]
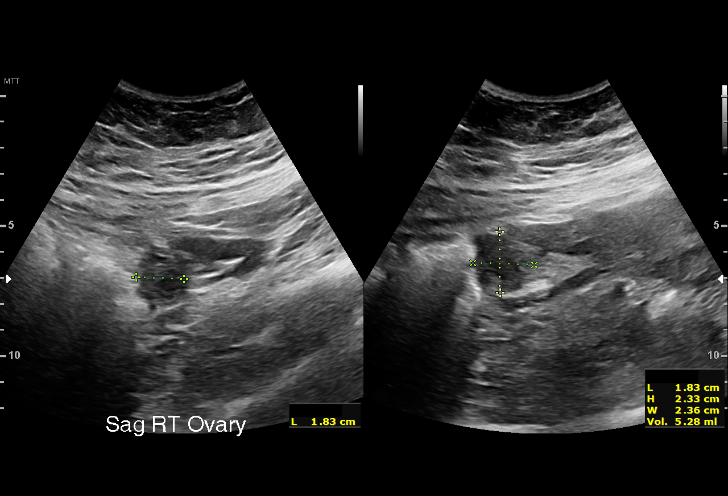
[im 25/96]
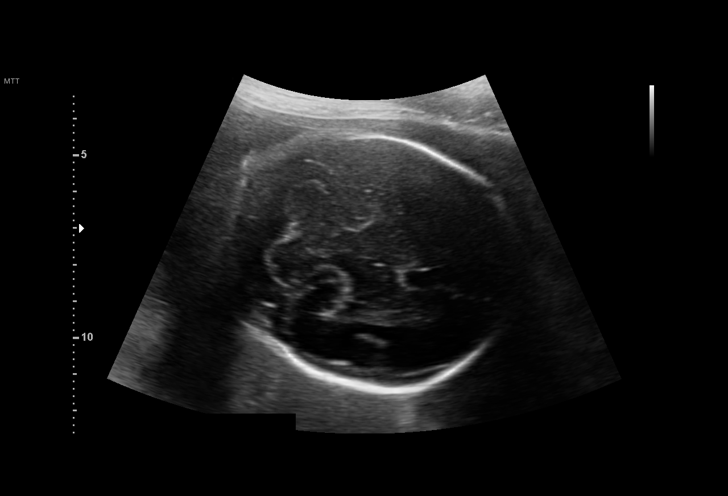
[im 32/96]
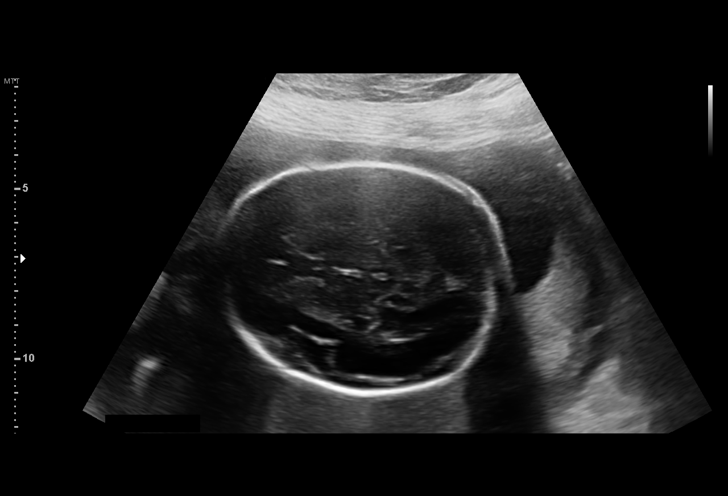
[im 39/96]
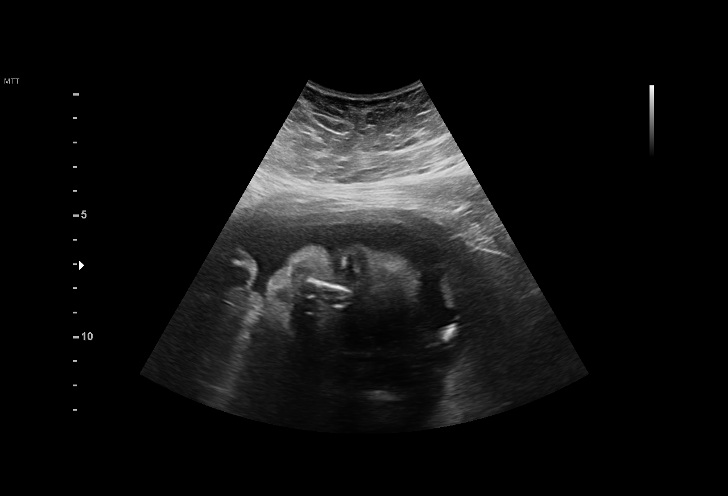
[im 50/96]
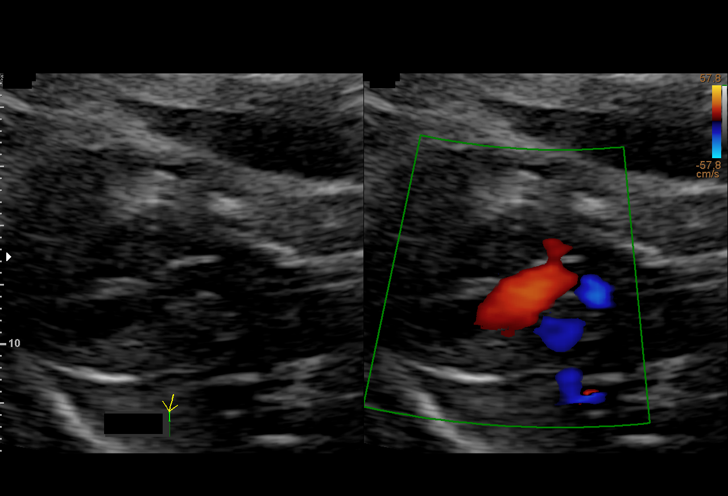
[im 57/96]
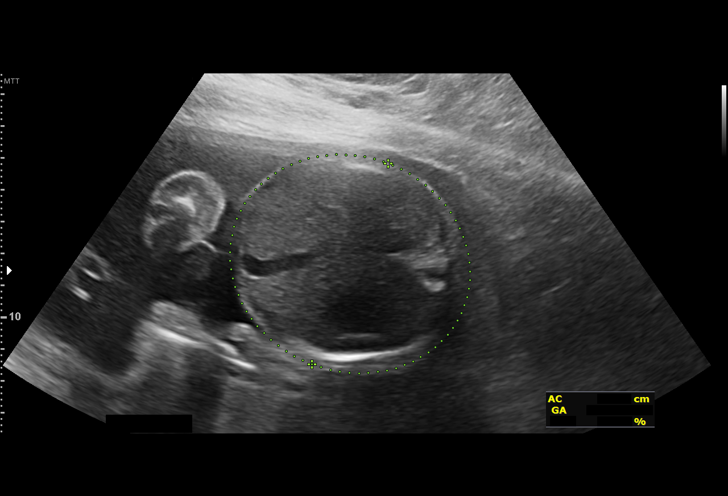
[im 64/96]
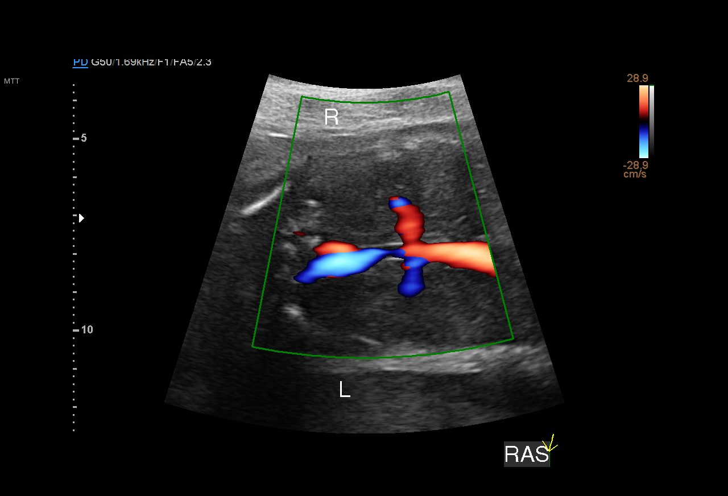
[im 71/96]
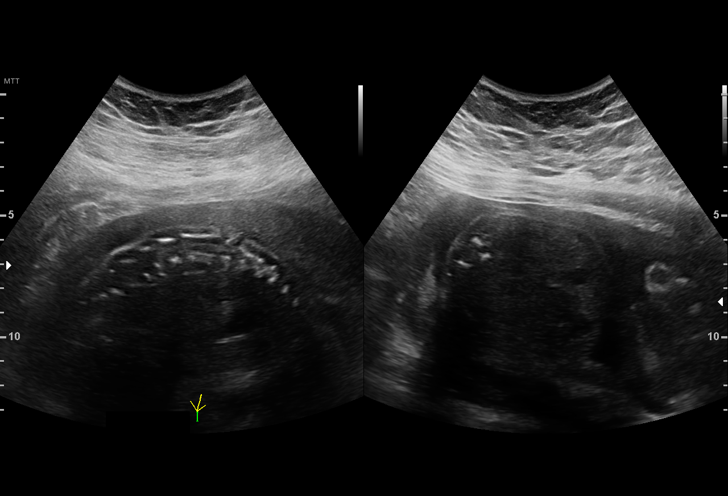
[im 78/96]
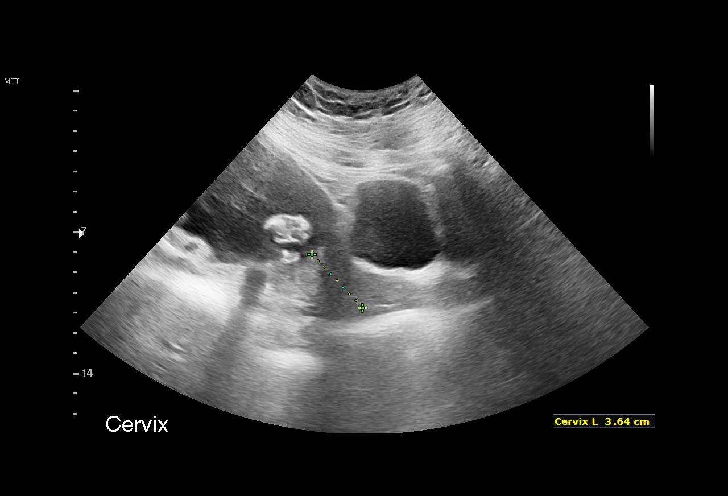
[im 85/96]
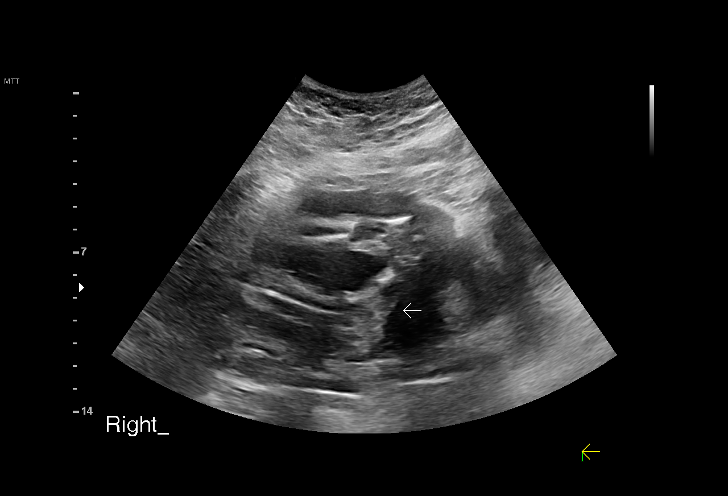
[im 92/96]
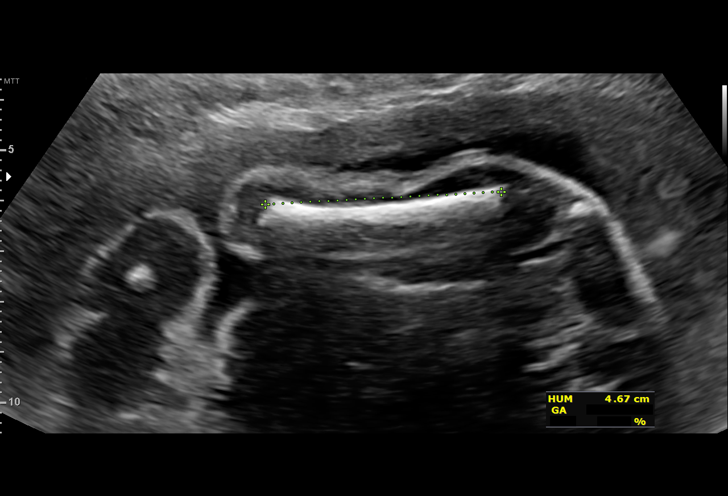

[13 of 28 positions shown; findings below may reference images not displayed]

[REDACTED]

Indications

 26 weeks gestation of pregnancy
 Obesity complicating pregnancy, second         [NP]
 trimester (BMI 39)
 LR NIPS
 Abnormal fetal ultrasound                      [NP]
 Seizure disorder                               [NP] [NP]
 Anxiety during pregnancy, second trimester     O99.342, [NP]
Fetal Evaluation

 Num Of Fetuses:         1
 Fetal Heart Rate(bpm):  145
 Cardiac Activity:       Observed
 Presentation:           Variable
 Placenta:               Posterior Fundal
 P. Cord Insertion:      Visualized, central

 Amniotic Fluid
 AFI FV:      Within normal limits

                             Largest Pocket(cm)

Biometry
 BPD:      67.8  mm     G. Age:  27w 2d         78  %    CI:        75.63   %    70 - 86
                                                         FL/HC:      19.7   %    18.6 -
 HC:      247.2  mm     G. Age:  26w 6d         49  %    HC/AC:      1.11        1.04 -
 AC:      223.3  mm     G. Age:  26w 5d         61  %    FL/BPD:     71.8   %    71 - 87
 FL:       48.7  mm     G. Age:  26w 3d         42  %    FL/AC:      21.8   %    20 - 24
 HUM:      46.4  mm     G. Age:  27w 2d         74  %
 CER:      31.6  mm     G. Age:  27w 2d         77  %

 LV:        4.1  mm
 CM:        7.6  mm

 Est. FW:     965  gm      2 lb 2 oz     61  %
OB History

 Gravidity:    1
Gestational Age

 LMP:           26w 5d        Date:  [DATE]                 EDD:   [DATE]
 U/S Today:     26w 6d                                        EDD:   [DATE]
 Best:          26w 1d     Det. By:  Early Ultrasound         EDD:   [DATE]
                                     ([DATE])
Anatomy

 Cranium:               Appears normal         LVOT:                   Not well visualized
 Cavum:                 Appears normal         Aortic Arch:            Not well visualized
 Ventricles:            Appears normal         Ductal Arch:            Not well visualized
 Choroid Plexus:        Appears normal         Diaphragm:              Appears normal
 Cerebellum:            Appears normal         Stomach:                Appears normal, left
                                                                       sided
 Posterior Fossa:       Appears normal         Abdomen:                Appears normal
 Nuchal Fold:           Not applicable (>20    Abdominal Wall:         Appears nml (cord
                        wks GA)                                        insert, abd wall)
 Face:                  Appears normal         Cord Vessels:           Appears normal (3
                        (orbits and profile)                           vessel cord)
 Lips:                  Appears normal         Kidneys:                Appear normal
 Palate:                Not well visualized    Bladder:                Appears normal
 Thoracic:              Appears normal         Spine:                  Ltd views no
                                                                       intracranial signs of
                                                                       NTD
 Heart:                 Appears normal         Upper Extremities:      Visualized Limited
                        (4CH, axis, and
                        situs)
 RVOT:                  Not well visualized    Lower Extremities:      Visualized Limited

 Other:  Nasal Bone and Lenses visualized. Technically difficult due to
         maternal habitus and fetal position.
Cervix Uterus Adnexa

 Cervix
 Length:           4.32  cm.
 Normal appearance by transabdominal scan.
 Uterus
 No abnormality visualized.

 Right Ovary
 Within normal limits.

 Left Ovary
 Within normal limits.

 Cul De Sac
 No free fluid seen.

 Adnexa
 No abnormality visualized.
Impression

 G1 P0.  Patient is here for fetal anatomy scan.  Fetal
 anatomical survey could not be completed at your office.
 On cell-free fetal DNA screening, the risks of fetal
 aneuploidies are not increased .
 Patient gives history of seizure disorder that she has not had
 seizures for many years.  She is not taking anticonvulsants.

 We performed fetal anatomy scan. No makers of
 aneuploidies or fetal structural defects are seen. Fetal
 biometry is consistent with her previously-established dates.
 Fetal anatomical survey could still not be completed because
 of fetal position and maternal body habitus.  Amniotic fluid is
 normal and good fetal activity is seen. Patient understands
 the limitations of ultrasound in detecting fetal anomalies.
 I reassured the patient that inability to complete fetal
 anatomical survey does not increase the risk of fetal
 anomalies.
Recommendations

 Recommend fetal growth assessment at 32 weeks gestation.
                 UDALEN

## 2020-12-13 LAB — OB RESULTS CONSOLE GBS: GBS: NEGATIVE

## 2020-12-14 ENCOUNTER — Other Ambulatory Visit: Payer: Self-pay

## 2020-12-14 ENCOUNTER — Inpatient Hospital Stay (HOSPITAL_COMMUNITY)
Admission: AD | Admit: 2020-12-14 | Discharge: 2020-12-14 | Disposition: A | Discharge status: Home / Self Care | Payer: No Typology Code available for payment source | Attending: Obstetrics and Gynecology | Admitting: Obstetrics and Gynecology

## 2020-12-14 ENCOUNTER — Encounter (HOSPITAL_COMMUNITY): Payer: Self-pay | Admitting: Obstetrics and Gynecology

## 2020-12-14 DIAGNOSIS — O99512 Diseases of the respiratory system complicating pregnancy, second trimester: Secondary | ICD-10-CM | POA: Insufficient documentation

## 2020-12-14 DIAGNOSIS — O368120 Decreased fetal movements, second trimester, not applicable or unspecified: Secondary | ICD-10-CM | POA: Insufficient documentation

## 2020-12-14 DIAGNOSIS — O368190 Decreased fetal movements, unspecified trimester, not applicable or unspecified: Secondary | ICD-10-CM | POA: Diagnosis not present

## 2020-12-14 DIAGNOSIS — Z3689 Encounter for other specified antenatal screening: Secondary | ICD-10-CM

## 2020-12-14 DIAGNOSIS — J069 Acute upper respiratory infection, unspecified: Secondary | ICD-10-CM | POA: Diagnosis not present

## 2020-12-14 DIAGNOSIS — Z20822 Contact with and (suspected) exposure to covid-19: Secondary | ICD-10-CM | POA: Insufficient documentation

## 2020-12-14 DIAGNOSIS — Z3A27 27 weeks gestation of pregnancy: Secondary | ICD-10-CM | POA: Diagnosis not present

## 2020-12-14 LAB — COMPREHENSIVE METABOLIC PANEL
ALT: 22 U/L (ref 0–44)
AST: 22 U/L (ref 15–41)
Albumin: 2.9 g/dL — ABNORMAL LOW (ref 3.5–5.0)
Alkaline Phosphatase: 99 U/L (ref 38–126)
Anion gap: 8 (ref 5–15)
BUN: <5 mg/dL — ABNORMAL LOW (ref 6–20)
CO2: 21 mmol/L — ABNORMAL LOW (ref 22–32)
Calcium: 8.8 mg/dL — ABNORMAL LOW (ref 8.9–10.3)
Chloride: 106 mmol/L (ref 98–111)
Creatinine, Ser: 0.36 mg/dL — ABNORMAL LOW (ref 0.44–1.00)
GFR, Estimated: >60 mL/min (ref >60)
Glucose, Bld: 102 mg/dL — ABNORMAL HIGH (ref 70–99)
Potassium: 3.5 mmol/L (ref 3.5–5.1)
Sodium: 135 mmol/L (ref 135–145)
Total Bilirubin: 0.8 mg/dL (ref 0.3–1.2)
Total Protein: 6 g/dL — ABNORMAL LOW (ref 6.5–8.1)

## 2020-12-14 LAB — CBC WITH DIFFERENTIAL/PLATELET
Abs Immature Granulocytes: 0.1 10*3/uL — ABNORMAL HIGH (ref 0.00–0.07)
Basophils Absolute: 0 10*3/uL (ref 0.0–0.1)
Basophils Relative: 0 %
Eosinophils Absolute: 0.1 10*3/uL (ref 0.0–0.5)
Eosinophils Relative: 1 %
HCT: 31.8 % — ABNORMAL LOW (ref 36.0–46.0)
Hemoglobin: 11.2 g/dL — ABNORMAL LOW (ref 12.0–15.0)
Immature Granulocytes: 1 %
Lymphocytes Relative: 9 %
Lymphs Abs: 1.1 10*3/uL (ref 0.7–4.0)
MCH: 31.2 pg (ref 26.0–34.0)
MCHC: 35.2 g/dL (ref 30.0–36.0)
MCV: 88.6 fL (ref 80.0–100.0)
Monocytes Absolute: 1.1 10*3/uL — ABNORMAL HIGH (ref 0.1–1.0)
Monocytes Relative: 10 %
Neutro Abs: 9.1 10*3/uL — ABNORMAL HIGH (ref 1.7–7.7)
Neutrophils Relative %: 79 %
Platelets: 280 10*3/uL (ref 150–400)
RBC: 3.59 MIL/uL — ABNORMAL LOW (ref 3.87–5.11)
RDW: 12.9 % (ref 11.5–15.5)
WBC: 11.5 10*3/uL — ABNORMAL HIGH (ref 4.0–10.5)
nRBC: 0 % (ref 0.0–0.2)

## 2020-12-14 LAB — URINALYSIS, ROUTINE W REFLEX MICROSCOPIC
Bilirubin Urine: NEGATIVE
Glucose, UA: NEGATIVE mg/dL
Hgb urine dipstick: NEGATIVE
Ketones, ur: NEGATIVE mg/dL
Leukocytes,Ua: NEGATIVE
Nitrite: NEGATIVE
Protein, ur: NEGATIVE mg/dL
Specific Gravity, Urine: 1.004 — ABNORMAL LOW (ref 1.005–1.030)
pH: 6 (ref 5.0–8.0)

## 2020-12-14 LAB — RESP PANEL BY RT-PCR (FLU A&B, COVID) ARPGX2
Influenza A by PCR: NEGATIVE
Influenza B by PCR: NEGATIVE
SARS Coronavirus 2 by RT PCR: NEGATIVE

## 2020-12-14 MED ORDER — ONDANSETRON 4 MG PO TBDP
8.0000 mg | ORAL_TABLET | Freq: Once | ORAL | Status: AC
  Administered 2020-12-14: 8 mg via ORAL
  Filled 2020-12-14: qty 2

## 2020-12-14 MED ORDER — LACTATED RINGERS IV BOLUS
1000.0000 mL | Freq: Once | INTRAVENOUS | Status: AC
  Administered 2020-12-14: 1000 mL via INTRAVENOUS

## 2020-12-14 MED ORDER — ACETAMINOPHEN 500 MG PO TABS: 1000.0000 mg | ORAL_TABLET | Freq: Once | ORAL | Status: DC

## 2020-12-14 NOTE — Discharge Instructions (Signed)

## 2020-12-14 NOTE — MAU Provider Note (Signed)
Chief Complaint:  Sore Throat and Decreased Fetal Movement   Event Date/Time   First Provider Initiated Contact with Patient 12/14/20 1843     HPI: Diane Guerrero is a 25 y.o. G1P0000 at [redacted]w[redacted]d who presents to maternity admissions reporting sore throat and cough that began Wednesday. 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 Took 100mg  of Tylenol at 5pm. Noted decreased fetal movement yesterday, with no fetal movement today. Denies vaginal bleeding or leaking of fluid. 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  Pregnancy Course: Receives care at [facility].  Past Medical History:  Diagnosis Date   Asthma    Complex partial seizure ([place])    Hyperglycemia    Obesity    POTS (postural orthostatic tachycardia syndrome)    Seasonal allergies    Seizures ([facility])    Syncope and collapse    Thrombocytosis    OB History  Gravida Para Term Preterm AB Living  1 0 0 0 0    SAB IAB Ectopic Multiple Live Births  0 0 0        # Outcome Date GA Lbr Len/2nd Weight Sex Delivery Anes PTL Lv  1 Current            Past Surgical History:  Procedure Laterality Date   LINGUAL FRENECTOMY  1996   MYRINGOTOMY Bilateral 1996   TYMPANOSTOMY TUBE PLACEMENT     TYMPANOSTOMY TUBE PLACEMENT Bilateral    Family History  Problem Relation Age of Onset   Allergies Mother    Asthma Mother    Heart disease Mother    Rheum arthritis Mother    Lupus Mother    Stroke Mother    Emphysema Maternal Grandmother    Allergies Maternal Grandmother    Cancer Maternal Grandmother        bladder   COPD Paternal Uncle    Heart disease Maternal Grandfather    Cancer Maternal Grandfather        lung   Lung disease Maternal Grandfather    Cancer Paternal Grandmother        breast   Diabetes Paternal Grandmother    Social History   Tobacco Use   Smoking  status: Never   Smokeless tobacco: Never  Vaping Use   Vaping Use: Never used  Substance Use Topics   Alcohol use: Not Currently   Drug use: Never   Allergies  Allergen Reactions   Peanut Oil    Suprax [Cefixime]     Hives, itching   Medications Prior to Admission  Medication Sig Dispense Refill Last Dose   folic acid (FOLVITE) 1 MG tablet Take 5 mg by mouth daily.   12/14/2020   Prenatal Vit-Fe Fumarate-FA (MULTIVITAMIN-PRENATAL) 27-0.8 MG TABS tablet Take 1 tablet by mouth daily at 12 noon.   12/14/2020   clobetasol ointment (TEMOVATE) 9.47 % Apply 1 application topically 2 (two) times daily. Apply to affected area (Patient not taking: Reported on 07/21/2017) 30 g 2    EPINEPHrine (EPI-PEN) 0.3 mg/0.3 mL SOAJ injection Inject 0.3 mg into the muscle as needed.   Unknown   norethindrone-ethinyl estradiol (JUNEL FE 1/20) 1-20 MG-MCG tablet Take 1 tablet by mouth daily. 3 Package 4    Topiramate ER (TROKENDI XR) 200 MG CP24 Take 200 mg by mouth at bedtime.    Unknown   Topiramate ER 200 MG  CS24 Take 200 mg by mouth daily.      I have reviewed patient's Past Medical Hx, Surgical Hx, Family Hx, Social Hx, medications and allergies.   ROS:  Review of Systems  Constitutional:  Positive for chills, fatigue and fever.  HENT:  Positive for sore throat. Negative for congestion and postnasal drip.   Eyes:  Negative for photophobia and visual disturbance.  Respiratory:  Positive for cough.   Gastrointestinal:  Positive for nausea. Negative for vomiting.  Genitourinary:  Negative for vaginal bleeding and vaginal discharge.  Neurological:  Negative for syncope and headaches.   Physical Exam  Patient Vitals for the past 24 hrs:  BP Temp Temp src Pulse Resp SpO2 Height Weight  12/14/20 1829 124/79 98.1 F (36.7 C) Oral (!) 122 (!) 22 100 % -- --  12/14/20 1813 133/81 99.2 F (37.3 C) Oral (!) 123 (!) 24 98 % -- --  12/14/20 1808 -- -- -- -- -- -- 5\' 8"  (1.727 m) 283 lb 9.6 oz (128.6 kg)    Constitutional: Well-developed, well-nourished, ill-appearing pregnant female Cardiovascular: tachycardic but normal rhythm and well perfused Respiratory: normal effort, no cough noted while in MAU GI: Abd soft, non-tender, gravid appropriate for gestational age MS: Extremities nontender, no edema, normal ROM Neurologic: Alert and oriented x 4.  GU: no CVA tenderness Pelvic: exam deferred  Fetal Tracing: reactive Baseline: 150 Variability: moderate Accelerations: 15x15 Decelerations: none Toco: relaxed   Labs: Results for orders placed or performed during the hospital encounter of 12/14/20 (from the past 24 hour(s))  Urinalysis, Routine w reflex microscopic Urine, Clean Catch     Status: Abnormal   Collection Time: 12/14/20  6:25 PM  Result Value Ref Range   Color, Urine STRAW (A) YELLOW   APPearance CLEAR CLEAR   Specific Gravity, Urine 1.004 (L) 1.005 - 1.030   pH 6.0 5.0 - 8.0   Glucose, UA NEGATIVE NEGATIVE mg/dL   Hgb urine dipstick NEGATIVE NEGATIVE   Bilirubin Urine NEGATIVE NEGATIVE   Ketones, ur NEGATIVE NEGATIVE mg/dL   Protein, ur NEGATIVE NEGATIVE mg/dL   Nitrite NEGATIVE NEGATIVE   Leukocytes,Ua NEGATIVE NEGATIVE  Resp Panel by RT-PCR (Flu A&B, Covid) Nasopharyngeal Swab     Status: None   Collection Time: 12/14/20  6:54 PM   Specimen: Nasopharyngeal Swab; Nasopharyngeal(NP) swabs in vial transport medium  Result Value Ref Range   SARS Coronavirus 2 by RT PCR NEGATIVE NEGATIVE   Influenza A by PCR NEGATIVE NEGATIVE   Influenza B by PCR NEGATIVE NEGATIVE  CBC with Differential/Platelet     Status: Abnormal   Collection Time: 12/14/20  7:52 PM  Result Value Ref Range   WBC 11.5 (H) 4.0 - 10.5 K/uL   RBC 3.59 (L) 3.87 - 5.11 MIL/uL   Hemoglobin 11.2 (L) 12.0 - 15.0 g/dL   HCT 31.8 (L) 36.0 - 46.0 %   MCV 88.6 80.0 - 100.0 fL   MCH 31.2 26.0 - 34.0 pg   MCHC 35.2 30.0 - 36.0 g/dL   RDW 12.9 11.5 - 15.5 %   Platelets 280 150 - 400 K/uL   nRBC 0.0 0.0 -  0.2 %   Neutrophils Relative % 79 %   Neutro Abs 9.1 (H) 1.7 - 7.7 K/uL   Lymphocytes Relative 9 %   Lymphs Abs 1.1 0.7 - 4.0 K/uL   Monocytes Relative 10 %   Monocytes Absolute 1.1 (H) 0.1 - 1.0 K/uL   Eosinophils Relative 1 %   Eosinophils Absolute 0.1  0.0 - 0.5 K/uL   Basophils Relative 0 %   Basophils Absolute 0.0 0.0 - 0.1 K/uL   Immature Granulocytes 1 %   Abs Immature Granulocytes 0.10 (H) 0.00 - 0.07 K/uL   Imaging:  Bedside ultrasound completed to assess for fetal movement since NST reactive but pt still could not feel fetal movement.  Baby positioned with head to maternal right in transverse position. Movement easily noted on ultrasound with hands moving around head/face and sucking motions. Legs also noted moving with one foot up by the face. Appropriate pockets of fluid also seen.  MAU Course: Orders Placed This Encounter  Procedures   Resp Panel by RT-PCR (Flu A&B, Covid) Nasopharyngeal Swab   Urinalysis, Routine w reflex microscopic Urine, Clean Catch   CBC with Differential/Platelet   Comprehensive metabolic panel   Discharge patient   Meds ordered this encounter  Medications   DISCONTD: acetaminophen (TYLENOL) tablet 1,000 mg   ondansetron (ZOFRAN-ODT) disintegrating tablet 8 mg   lactated ringers bolus 1,000 mL   MDM: LR bolus and oral zofran given with relief of nausea, no Tylenol given as too soon since patient's dose. Respiratory panel ordered - normal, along with urinalysis. No BPP done since GA only 27wks. Pt expressed relief after seeing fetal movement on bedside ultrasound and understanding how fetal positioning is affecting how she feels movements. Encouraged her to do some hands/knees pelvic rocking to facilitate fetal position change and descent of head into cephalic presentation.  Viral respiratory illness likely, safe med list given, encouraged to follow up with OB or PCP if she does not get better within the next few days.   Assessment: 1. Viral  upper respiratory illness   2. Decreased fetal movement   3. NST (non-stress test) reactive   4. [redacted] weeks gestation of pregnancy    Plan: Discharge home in stable condition with return precautions.    Follow-up Information     [facility]   Why: as scheduled for ongoing prenatal care Contact information: [place] [id number] [phone]                 Allergies as of 12/14/2020       Reactions   Peanut Oil    Suprax [cefixime]    Hives, itching        Medication List     STOP taking these medications    clobetasol ointment 0.05 % Commonly known as: TEMOVATE   norethindrone-ethinyl estradiol-FE 1-20 MG-MCG tablet Commonly known as: Junel FE 1/20   Topiramate ER 200 MG Cp24 Commonly known as: TROKENDI XR   topiramate ER 200 MG Cs24 sprinkle capsule Commonly known as: QUDEXY XR       TAKE these medications    EPINEPHrine 0.3 mg/0.3 mL Soaj injection Commonly known as: EPI-PEN Inject 0.3 mg into the muscle as needed.   folic acid 1 MG tablet Commonly known as: FOLVITE Take 5 mg by mouth daily.   multivitamin-prenatal 27-0.8 MG Tabs tablet Take 1 tablet by mouth daily at 12 noon.       Gaylan Gerold, CNM, MSN, [place] Certified Nurse Midwife, [facility]

## 2020-12-14 NOTE — MAU Note (Signed)
Diane Guerrero is a 25 y.o. at [redacted]w[redacted]d here in MAU reporting: has been having cough, sore throat, and nausea since Wednesday. States DFM since yesterday and none today.   Onset of complaint: ongoing  Pain score: 7/10  Vitals:   12/14/20 1813  BP: 133/81  Pulse: (!) 123  Resp: (!) 24  Temp: 99.2 F (37.3 C)  SpO2: 98%     FHT:159  Lab orders placed from triage: UA

## 2021-01-16 ENCOUNTER — Ambulatory Visit: Payer: No Typology Code available for payment source

## 2021-01-20 NOTE — L&D Delivery Note (Signed)
PROCEDURE DATE: Primary C section   PREOPERATIVE DIAGNOSIS: A2GDM, BPP 4/8 at 37 wga   POSTOPERATIVE DIAGNOSIS: The same   PROCEDURE:    Primary Low Transverse Cesarean Section   SURGEON:  Dr. Alpha Gula   INDICATIONS: This is a 26yo G1P0 at [redacted]w[redacted]d wga requiring cesarean section secondary to BPP in office of 4/8.   Decision made to proceed with LTCS. The risks of cesarean section discussed with the patient included but were not limited to: bleeding which may require transfusion or reoperation; infection which may require antibiotics; injury to bowel, bladder, ureters or other surrounding organs; injury to the fetus; need for additional procedures including hysterectomy in the event of a life-threatening hemorrhage; placental abnormalities wth subsequent pregnancies, incisional problems, thromboembolic phenomenon and other postoperative/anesthesia complications. The patient agreed with the proposed plan, giving informed consent for the procedure.     FINDINGS:  Viable female infant in vertex presentation, APGARs8, 9,  Weight pending, Amniotic fluid clear,  Intact placenta, three vessel cord.  Grossly normal uterus  .   ANESTHESIA:    Epidural ESTIMATED BLOOD LOSS: 395 cc SPECIMENS: Placenta for path (abnormal BPP) COMPLICATIONS: None immediate    PROCEDURE IN DETAIL:  The patient received intravenous antibiotics (gent/clinda) and had sequential compression devices applied to her lower extremities while in the preoperative area.  She was then taken to the operating room where epidural anesthesia was dosed up to surgical level and was found to be adequate. She was then placed in a dorsal supine position with a leftward tilt, and prepped and draped in a sterile manner.  A foley catheter was placed into her bladder and attached to constant gravity.  After an adequate timeout was performed, a Pfannenstiel skin incision was made with scalpel and carried through to the underlying layer of fascia. The  fascia was incised in the midline and this incision was extended bilaterally using the Mayo scissors. Kocher clamps were applied to the superior aspect of the fascial incision and the underlying rectus muscles were dissected off bluntly. A similar process was carried out on the inferior aspect of the facial incision. The rectus muscles were separated in the midline bluntly and the peritoneum was entered bluntly.  A bladder flap was created sharply and developed bluntly. A transverse hysterotomy was made with a scalpel and extended bilaterally bluntly. The bladder blade was then removed. The infant was successfully delivered, and cord was clamped and cut and infant was handed over to awaiting neonatology team. Uterine massage was then administered and the placenta delivered intact with three-vessel cord. The uterus was cleared of clot and debris.  The hysterotomy was closed with 0 vicryl.  A second imbricating suture of 0-vicryl was used to reinforce the incision and aid in hemostasis.The fascia was closed with 0-Vicryl in a running fashion with good restoration of anatomy.  The subcutaneus tissue was irrigated and was reapproximated using running plain gut stitches.  The skin was closed with 4-0 Vicryl in a subcuticular fashion.  All surgical site and was hemostatic at end of procedure) without any further bleeding on exam.    Pt tolerated the procedure well. All sponge/lap/needle counts were correct  X 2. Pt taken to recovery room in stable condition.     Alpha Gula MD

## 2021-01-27 ENCOUNTER — Inpatient Hospital Stay (HOSPITAL_COMMUNITY)
Admission: AD | Admit: 2021-01-27 | Discharge: 2021-01-27 | Disposition: A | Discharge status: Home / Self Care | Payer: PRIVATE HEALTH INSURANCE | Attending: Obstetrics and Gynecology | Admitting: Obstetrics and Gynecology

## 2021-01-27 ENCOUNTER — Encounter (HOSPITAL_COMMUNITY): Payer: Self-pay | Admitting: Obstetrics and Gynecology

## 2021-01-27 ENCOUNTER — Other Ambulatory Visit: Payer: Self-pay

## 2021-01-27 DIAGNOSIS — O26893 Other specified pregnancy related conditions, third trimester: Secondary | ICD-10-CM | POA: Insufficient documentation

## 2021-01-27 DIAGNOSIS — Z3A34 34 weeks gestation of pregnancy: Secondary | ICD-10-CM | POA: Diagnosis not present

## 2021-01-27 DIAGNOSIS — Z3689 Encounter for other specified antenatal screening: Secondary | ICD-10-CM

## 2021-01-27 DIAGNOSIS — M549 Dorsalgia, unspecified: Secondary | ICD-10-CM | POA: Diagnosis not present

## 2021-01-27 DIAGNOSIS — R030 Elevated blood-pressure reading, without diagnosis of hypertension: Secondary | ICD-10-CM | POA: Insufficient documentation

## 2021-01-27 DIAGNOSIS — O368130 Decreased fetal movements, third trimester, not applicable or unspecified: Secondary | ICD-10-CM | POA: Diagnosis not present

## 2021-01-27 LAB — COMPREHENSIVE METABOLIC PANEL
ALT: 15 U/L (ref 0–44)
AST: 14 U/L — ABNORMAL LOW (ref 15–41)
Albumin: 2.9 g/dL — ABNORMAL LOW (ref 3.5–5.0)
Alkaline Phosphatase: 115 U/L (ref 38–126)
Anion gap: 5 (ref 5–15)
BUN: <5 mg/dL — ABNORMAL LOW (ref 6–20)
CO2: 20 mmol/L — ABNORMAL LOW (ref 22–32)
Calcium: 8.9 mg/dL (ref 8.9–10.3)
Chloride: 109 mmol/L (ref 98–111)
Creatinine, Ser: 0.47 mg/dL (ref 0.44–1.00)
GFR, Estimated: >60 mL/min (ref >60)
Glucose, Bld: 101 mg/dL — ABNORMAL HIGH (ref 70–99)
Potassium: 3.3 mmol/L — ABNORMAL LOW (ref 3.5–5.1)
Sodium: 134 mmol/L — ABNORMAL LOW (ref 135–145)
Total Bilirubin: 0.8 mg/dL (ref 0.3–1.2)
Total Protein: 6.3 g/dL — ABNORMAL LOW (ref 6.5–8.1)

## 2021-01-27 LAB — CBC
HCT: 34.6 % — ABNORMAL LOW (ref 36.0–46.0)
Hemoglobin: 12.2 g/dL (ref 12.0–15.0)
MCH: 30.6 pg (ref 26.0–34.0)
MCHC: 35.3 g/dL (ref 30.0–36.0)
MCV: 86.7 fL (ref 80.0–100.0)
Platelets: 310 10*3/uL (ref 150–400)
RBC: 3.99 MIL/uL (ref 3.87–5.11)
RDW: 13.4 % (ref 11.5–15.5)
WBC: 14.7 10*3/uL — ABNORMAL HIGH (ref 4.0–10.5)
nRBC: 0 % (ref 0.0–0.2)

## 2021-01-27 LAB — URINALYSIS, ROUTINE W REFLEX MICROSCOPIC
Bilirubin Urine: NEGATIVE
Glucose, UA: NEGATIVE mg/dL
Hgb urine dipstick: NEGATIVE
Ketones, ur: NEGATIVE mg/dL
Leukocytes,Ua: NEGATIVE
Nitrite: NEGATIVE
Protein, ur: NEGATIVE mg/dL
Specific Gravity, Urine: 1.02 (ref 1.005–1.030)
pH: 6.5 (ref 5.0–8.0)

## 2021-01-27 LAB — PROTEIN / CREATININE RATIO, URINE
Creatinine, Urine: 83.35 mg/dL
Protein Creatinine Ratio: 0.11 mg/mg{Cre} (ref 0.00–0.15)
Total Protein, Urine: 9 mg/dL

## 2021-01-27 NOTE — MAU Note (Signed)
Diane Guerrero is a 26 y.o. at [redacted]w[redacted]d here in MAU reporting: c/o back pain that started yesterday and is constant. Feeling less fetal movement.  LMP: 05/30/20 Onset of complaint: 01/28/20 Pain score: 6/10 Vitals:   01/27/21 2000 01/27/21 2002  BP:  (!) 142/85  Pulse:  (!) 107  Resp:  20  Temp:  97.8 F (36.6 C)  SpO2: 100%      FHT:156 Lab orders placed from triage:

## 2021-01-27 NOTE — MAU Provider Note (Signed)
History     CSN: [id number]  Arrival date and time: 01/27/21 1955   Event Date/Time   First Provider Initiated Contact with Patient 01/27/21 2040      Chief Complaint  Patient presents with   Back Pain   Decreased Fetal Movement   HPI Diane Guerrero is a 26 y.o. G1P0000 at [redacted]w[redacted]d who presents with back pain & decreased fetal movement.  Reports decreased movement since this morning. Felt baby move once this morning and then no further movement until arriving to MAU this evening.  Constant aching back pain since this morning upon waking. States it feels like she pulled a muscle. Rates pain 6/10. Worse with standing & walking. Treated with heat & tylenol with some relief. Denies n/v, abdominal pain, dysuria, hematuria, vaginal bleeding, or LOF.  Denies history of hypertension. Denies headache, visual disturbance, or epigastric pain.  Has OB appointment tomorrow.   OB History     Gravida  1   Para  0   Term  0   Preterm  0   AB  0   Living         SAB  0   IAB  0   Ectopic  0   Multiple      Live Births              Past Medical History:  Diagnosis Date   Asthma    Complex partial seizure (HCC)    Hyperglycemia    Obesity    POTS (postural orthostatic tachycardia syndrome)    Seasonal allergies    Seizures (HCC)    Syncope and collapse    Thrombocytosis     Past Surgical History:  Procedure Laterality Date   LINGUAL FRENECTOMY  1996   MYRINGOTOMY Bilateral 1996   TYMPANOSTOMY TUBE PLACEMENT Bilateral     Family History  Problem Relation Age of Onset   Allergies Mother    Asthma Mother    Heart disease Mother    Rheum arthritis Mother    Lupus Mother    Stroke Mother    Emphysema Maternal Grandmother    Allergies Maternal Grandmother    Cancer Maternal Grandmother        bladder   COPD Paternal Uncle    Heart disease Maternal Grandfather    Cancer Maternal Grandfather        lung   Lung disease Maternal Grandfather    Cancer Paternal  Grandmother        breast   Diabetes Paternal Grandmother     Social History   Tobacco Use   Smoking status: Never   Smokeless tobacco: Never  Vaping Use   Vaping Use: Never used  Substance Use Topics   Alcohol use: Not Currently   Drug use: Never    Allergies:  Allergies  Allergen Reactions   Fire Ant Hives   Peanut Oil    Suprax [Cefixime]     Hives, itching    Medications Prior to Admission  Medication Sig Dispense Refill Last Dose   glipiZIDE (GLUCOTROL XL) 2.5 MG 24 hr tablet Take 2.5 mg by mouth at bedtime.   01/27/2021   EPINEPHrine (EPI-PEN) 0.3 mg/0.3 mL SOAJ injection Inject 0.3 mg into the muscle as needed.      folic acid (FOLVITE) 1 MG tablet Take 5 mg by mouth daily.      Prenatal Vit-Fe Fumarate-FA (MULTIVITAMIN-PRENATAL) 27-0.8 MG TABS tablet Take 1 tablet by mouth daily at 12 noon.  sertraline (ZOLOFT) 50 MG tablet Take 50 mg by mouth daily.       Review of Systems  Constitutional: Negative.   Eyes:  Negative for visual disturbance.  Gastrointestinal: Negative.   Genitourinary: Negative.   Musculoskeletal:  Positive for back pain.  Neurological:  Negative for headaches.  Physical Exam   Blood pressure 128/78, pulse 98, temperature 97.8 F (36.6 C), temperature source Oral, resp. rate 20, height 5\' 8"  (1.727 m), weight 131 kg, last menstrual period 05/30/2020, SpO2 100 %. Patient Vitals for the past 24 hrs:  BP Temp Temp src Pulse Resp SpO2 Height Weight  01/27/21 2146 128/78 -- -- 98 -- -- -- --  01/27/21 2131 127/74 -- -- 97 -- -- -- --  01/27/21 2116 128/76 -- -- 100 -- -- -- --  01/27/21 2101 139/73 -- -- 93 -- -- -- --  01/27/21 2046 (!) 143/90 -- -- (!) 102 -- -- -- --  01/27/21 2035 (!) 141/86 -- -- 97 -- -- -- --  01/27/21 2002 (!) 142/85 97.8 F (36.6 C) Oral (!) 107 20 -- 5\' 8"  (1.727 m) 131 kg  01/27/21 2000 -- -- -- -- -- 100 % -- --    Physical Exam Vitals and nursing note reviewed.  Constitutional:      General: She is not  in acute distress.    Appearance: Normal appearance.  HENT:     Head: Normocephalic and atraumatic.  Eyes:     General: No scleral icterus.    Conjunctiva/sclera: Conjunctivae normal.  Cardiovascular:     Rate and Rhythm: Normal rate and regular rhythm.     Heart sounds: Normal heart sounds.  Pulmonary:     Effort: Pulmonary effort is normal. No respiratory distress.     Breath sounds: Normal breath sounds.  Abdominal:     Tenderness: There is no right CVA tenderness or left CVA tenderness.  Skin:    General: Skin is warm and dry.  Neurological:     General: No focal deficit present.     Mental Status: She is alert.  Psychiatric:        Mood and Affect: Mood normal.        Behavior: Behavior normal.   NST:  Baseline: 135 bpm, Variability: Good {> 6 bpm), Accelerations: Reactive, and Decelerations: Absent  MAU Course  Procedures Results for orders placed or performed during the hospital encounter of 01/27/21 (from the past 24 hour(s))  Urinalysis, Routine w reflex microscopic Urine, Clean Catch     Status: None   Collection Time: 01/27/21  8:43 PM  Result Value Ref Range   Color, Urine YELLOW YELLOW   APPearance CLEAR CLEAR   Specific Gravity, Urine 1.020 1.005 - 1.030   pH 6.5 5.0 - 8.0   Glucose, UA NEGATIVE NEGATIVE mg/dL   Hgb urine dipstick NEGATIVE NEGATIVE   Bilirubin Urine NEGATIVE NEGATIVE   Ketones, ur NEGATIVE NEGATIVE mg/dL   Protein, ur NEGATIVE NEGATIVE mg/dL   Nitrite NEGATIVE NEGATIVE   Leukocytes,Ua NEGATIVE NEGATIVE  Protein / creatinine ratio, urine     Status: None   Collection Time: 01/27/21  8:54 PM  Result Value Ref Range   Creatinine, Urine 83.35 mg/dL   Total Protein, Urine 9 mg/dL   Protein Creatinine Ratio 0.11 0.00 - 0.15 mg/mg[Cre]  CBC     Status: Abnormal   Collection Time: 01/27/21  9:10 PM  Result Value Ref Range   WBC 14.7 (H) 4.0 - 10.5 K/uL   RBC  3.99 3.87 - 5.11 MIL/uL   Hemoglobin 12.2 12.0 - 15.0 g/dL   HCT 34.6 (L) 36.0 -  46.0 %   MCV 86.7 80.0 - 100.0 fL   MCH 30.6 26.0 - 34.0 pg   MCHC 35.3 30.0 - 36.0 g/dL   RDW 13.4 11.5 - 15.5 %   Platelets 310 150 - 400 K/uL   nRBC 0.0 0.0 - 0.2 %  Comprehensive metabolic panel     Status: Abnormal   Collection Time: 01/27/21  9:10 PM  Result Value Ref Range   Sodium 134 (L) 135 - 145 mmol/L   Potassium 3.3 (L) 3.5 - 5.1 mmol/L   Chloride 109 98 - 111 mmol/L   CO2 20 (L) 22 - 32 mmol/L   Glucose, Bld 101 (H) 70 - 99 mg/dL   BUN <5 (L) 6 - 20 mg/dL   Creatinine, Ser 0.47 0.44 - 1.00 mg/dL   Calcium 8.9 8.9 - 10.3 mg/dL   Total Protein 6.3 (L) 6.5 - 8.1 g/dL   Albumin 2.9 (L) 3.5 - 5.0 g/dL   AST 14 (L) 15 - 41 U/L   ALT 15 0 - 44 U/L   Alkaline Phosphatase 115 38 - 126 U/L   Total Bilirubin 0.8 0.3 - 1.2 mg/dL   GFR, Estimated >60 >60 mL/min   Anion gap 5 5 - 15    MDM Patient reports good fetal movement while in MAU & has reactive fetal tracing.   Reports mild low back pain. Has negative u/a & no CVA tenderness. Declines treatment for back pain.   New onset elevated BPs in MAU. Normalized without treatment. None severe range. Asymptomatic. Normal preeclampsia labs.  Does not meet criteria for GHTN or PreE diagnosis at this time. Has close f/u with prenatal appointment tomorrow morning.   Assessment and Plan   1. Decreased fetal movements in third trimester, single or unspecified fetus  -reviewed fetal movement expectations & reasons to return to MAU  2. NST (non-stress test) reactive   3. Elevated BP without diagnosis of hypertension  -reviewed s/s of preeclampsia  4. [redacted] weeks gestation of pregnancy      Jorje Guild 01/27/2021, 10:00 PM

## 2021-02-19 ENCOUNTER — Encounter (HOSPITAL_COMMUNITY): Payer: Self-pay | Admitting: Obstetrics & Gynecology

## 2021-02-19 ENCOUNTER — Encounter (HOSPITAL_COMMUNITY): Payer: Self-pay

## 2021-02-19 ENCOUNTER — Inpatient Hospital Stay (HOSPITAL_COMMUNITY)
Admission: AD | Admit: 2021-02-19 | Discharge: 2021-02-22 | DRG: 788 | Disposition: A | Discharge status: Home / Self Care | Payer: PRIVATE HEALTH INSURANCE | Attending: Obstetrics and Gynecology | Admitting: Obstetrics and Gynecology

## 2021-02-19 ENCOUNTER — Inpatient Hospital Stay (HOSPITAL_COMMUNITY): Payer: PRIVATE HEALTH INSURANCE | Admitting: Anesthesiology

## 2021-02-19 ENCOUNTER — Encounter (HOSPITAL_COMMUNITY)
Admission: AD | Disposition: A | Discharge status: Home / Self Care | Payer: Self-pay | Source: Home / Self Care | Attending: Obstetrics and Gynecology

## 2021-02-19 ENCOUNTER — Other Ambulatory Visit: Payer: Self-pay | Admitting: Obstetrics and Gynecology

## 2021-02-19 ENCOUNTER — Other Ambulatory Visit: Payer: Self-pay

## 2021-02-19 DIAGNOSIS — O26893 Other specified pregnancy related conditions, third trimester: Secondary | ICD-10-CM | POA: Diagnosis present

## 2021-02-19 DIAGNOSIS — Z20822 Contact with and (suspected) exposure to covid-19: Secondary | ICD-10-CM | POA: Diagnosis present

## 2021-02-19 DIAGNOSIS — O24425 Gestational diabetes mellitus in childbirth, controlled by oral hypoglycemic drugs: Secondary | ICD-10-CM | POA: Diagnosis present

## 2021-02-19 DIAGNOSIS — Z6791 Unspecified blood type, Rh negative: Secondary | ICD-10-CM

## 2021-02-19 DIAGNOSIS — O99214 Obesity complicating childbirth: Secondary | ICD-10-CM | POA: Diagnosis present

## 2021-02-19 DIAGNOSIS — Z3490 Encounter for supervision of normal pregnancy, unspecified, unspecified trimester: Secondary | ICD-10-CM

## 2021-02-19 DIAGNOSIS — Z3A37 37 weeks gestation of pregnancy: Secondary | ICD-10-CM | POA: Diagnosis not present

## 2021-02-19 DIAGNOSIS — Z3A39 39 weeks gestation of pregnancy: Secondary | ICD-10-CM

## 2021-02-19 HISTORY — DX: Anxiety disorder, unspecified: F41.9

## 2021-02-19 LAB — CBC
HCT: 37 % (ref 36.0–46.0)
Hemoglobin: 12.7 g/dL (ref 12.0–15.0)
MCH: 30.1 pg (ref 26.0–34.0)
MCHC: 34.3 g/dL (ref 30.0–36.0)
MCV: 87.7 fL (ref 80.0–100.0)
Platelets: 322 10*3/uL (ref 150–400)
RBC: 4.22 MIL/uL (ref 3.87–5.11)
RDW: 13.6 % (ref 11.5–15.5)
WBC: 17.2 10*3/uL — ABNORMAL HIGH (ref 4.0–10.5)
nRBC: 0 % (ref 0.0–0.2)

## 2021-02-19 LAB — RESP PANEL BY RT-PCR (FLU A&B, COVID) ARPGX2
Influenza A by PCR: NEGATIVE
Influenza B by PCR: NEGATIVE
SARS Coronavirus 2 by RT PCR: NEGATIVE

## 2021-02-19 LAB — TYPE AND SCREEN
ABO/RH(D): O NEG
Antibody Screen: NEGATIVE

## 2021-02-19 LAB — GLUCOSE, CAPILLARY
Glucose-Capillary: 82 mg/dL (ref 70–99)
Glucose-Capillary: 85 mg/dL (ref 70–99)

## 2021-02-19 SURGERY — Surgical Case
Anesthesia: Spinal

## 2021-02-19 MED ORDER — OXYTOCIN-SODIUM CHLORIDE 30-0.9 UT/500ML-% IV SOLN
INTRAVENOUS | Status: DC | PRN
  Administered 2021-02-19: 30 [IU] via INTRAVENOUS

## 2021-02-19 MED ORDER — DEXMEDETOMIDINE (PRECEDEX) IN NS 20 MCG/5ML (4 MCG/ML) IV SYRINGE
PREFILLED_SYRINGE | INTRAVENOUS | Status: AC
  Filled 2021-02-19: qty 5

## 2021-02-19 MED ORDER — ACETAMINOPHEN 500 MG PO TABS
1000.0000 mg | ORAL_TABLET | Freq: Four times a day (QID) | ORAL | Status: AC
  Administered 2021-02-20 (×3): 1000 mg via ORAL
  Filled 2021-02-19 (×3): qty 2

## 2021-02-19 MED ORDER — ONDANSETRON HCL 4 MG/2ML IJ SOLN
INTRAMUSCULAR | Status: DC | PRN
  Administered 2021-02-19: 4 mg via INTRAVENOUS

## 2021-02-19 MED ORDER — STERILE WATER FOR IRRIGATION IR SOLN
Status: DC | PRN
  Administered 2021-02-19: 1

## 2021-02-19 MED ORDER — PHENYLEPHRINE 40 MCG/ML (10ML) SYRINGE FOR IV PUSH (FOR BLOOD PRESSURE SUPPORT)
PREFILLED_SYRINGE | INTRAVENOUS | Status: AC
  Filled 2021-02-19: qty 10

## 2021-02-19 MED ORDER — ONDANSETRON HCL 4 MG/2ML IJ SOLN
INTRAMUSCULAR | Status: AC
  Filled 2021-02-19: qty 2

## 2021-02-19 MED ORDER — METOCLOPRAMIDE HCL 5 MG/ML IJ SOLN
INTRAMUSCULAR | Status: AC
  Filled 2021-02-19: qty 2

## 2021-02-19 MED ORDER — SODIUM CHLORIDE 0.9 % IV SOLN: INTRAVENOUS | Status: DC | PRN

## 2021-02-19 MED ORDER — DIPHENHYDRAMINE HCL 50 MG/ML IJ SOLN: 12.5000 mg | INTRAMUSCULAR | Status: DC | PRN

## 2021-02-19 MED ORDER — OXYTOCIN-SODIUM CHLORIDE 30-0.9 UT/500ML-% IV SOLN
INTRAVENOUS | Status: AC
  Filled 2021-02-19: qty 500

## 2021-02-19 MED ORDER — BUPIVACAINE IN DEXTROSE 0.75-8.25 % IT SOLN
INTRATHECAL | Status: DC | PRN
  Administered 2021-02-19: 1.6 mL via INTRATHECAL

## 2021-02-19 MED ORDER — SCOPOLAMINE 1 MG/3DAYS TD PT72
MEDICATED_PATCH | TRANSDERMAL | Status: AC
  Filled 2021-02-19: qty 1

## 2021-02-19 MED ORDER — GENTAMICIN SULFATE 40 MG/ML IJ SOLN
5.0000 mg/kg | INTRAVENOUS | Status: AC
  Administered 2021-02-19: 460 mg via INTRAVENOUS
  Filled 2021-02-19: qty 11.5

## 2021-02-19 MED ORDER — LACTATED RINGERS IV SOLN: INTRAVENOUS | Status: DC

## 2021-02-19 MED ORDER — DEXAMETHASONE SODIUM PHOSPHATE 4 MG/ML IJ SOLN
INTRAMUSCULAR | Status: AC
  Filled 2021-02-19: qty 1

## 2021-02-19 MED ORDER — NALOXONE HCL 0.4 MG/ML IJ SOLN: 0.4000 mg | INTRAMUSCULAR | Status: DC | PRN

## 2021-02-19 MED ORDER — POVIDONE-IODINE 10 % EX SWAB
2.0000 "application " | Freq: Once | CUTANEOUS | Status: AC
  Administered 2021-02-19: 2 via TOPICAL

## 2021-02-19 MED ORDER — DEXMEDETOMIDINE (PRECEDEX) IN NS 20 MCG/5ML (4 MCG/ML) IV SYRINGE
PREFILLED_SYRINGE | INTRAVENOUS | Status: DC | PRN
  Administered 2021-02-19: 8 ug via INTRAVENOUS

## 2021-02-19 MED ORDER — FENTANYL CITRATE (PF) 100 MCG/2ML IJ SOLN: 25.0000 ug | INTRAMUSCULAR | Status: DC | PRN

## 2021-02-19 MED ORDER — SODIUM CHLORIDE 0.9 % IR SOLN
Status: DC | PRN
  Administered 2021-02-19: 1

## 2021-02-19 MED ORDER — SODIUM CHLORIDE 0.9% FLUSH: 3.0000 mL | INTRAVENOUS | Status: DC | PRN

## 2021-02-19 MED ORDER — FENTANYL CITRATE (PF) 100 MCG/2ML IJ SOLN
INTRAMUSCULAR | Status: AC
  Filled 2021-02-19: qty 2

## 2021-02-19 MED ORDER — DIPHENHYDRAMINE HCL 25 MG PO CAPS: 25.0000 mg | ORAL_CAPSULE | ORAL | Status: DC | PRN

## 2021-02-19 MED ORDER — SCOPOLAMINE 1 MG/3DAYS TD PT72
MEDICATED_PATCH | TRANSDERMAL | Status: DC | PRN
  Administered 2021-02-19: 1 mg via TRANSDERMAL

## 2021-02-19 MED ORDER — FENTANYL CITRATE (PF) 100 MCG/2ML IJ SOLN
INTRAMUSCULAR | Status: DC | PRN
  Administered 2021-02-19: 15 ug via INTRATHECAL

## 2021-02-19 MED ORDER — CLINDAMYCIN PHOSPHATE 900 MG/50ML IV SOLN
INTRAVENOUS | Status: AC
  Filled 2021-02-19: qty 50

## 2021-02-19 MED ORDER — ACETAMINOPHEN 10 MG/ML IV SOLN
INTRAVENOUS | Status: AC
  Filled 2021-02-19: qty 100

## 2021-02-19 MED ORDER — DIPHENHYDRAMINE HCL 50 MG/ML IJ SOLN
INTRAMUSCULAR | Status: DC | PRN
  Administered 2021-02-19: 25 mg via INTRAVENOUS

## 2021-02-19 MED ORDER — KETOROLAC TROMETHAMINE 30 MG/ML IJ SOLN: 30.0000 mg | Freq: Four times a day (QID) | INTRAMUSCULAR | Status: AC | PRN

## 2021-02-19 MED ORDER — ONDANSETRON HCL 4 MG/2ML IJ SOLN: 4.0000 mg | Freq: Three times a day (TID) | INTRAMUSCULAR | Status: DC | PRN

## 2021-02-19 MED ORDER — NALOXONE HCL 4 MG/10ML IJ SOLN
1.0000 ug/kg/h | INTRAVENOUS | Status: DC | PRN
  Filled 2021-02-19: qty 5

## 2021-02-19 MED ORDER — KETOROLAC TROMETHAMINE 30 MG/ML IJ SOLN
INTRAMUSCULAR | Status: AC
  Filled 2021-02-19: qty 1

## 2021-02-19 MED ORDER — MORPHINE SULFATE (PF) 0.5 MG/ML IJ SOLN
INTRAMUSCULAR | Status: DC | PRN
  Administered 2021-02-19: .15 mg via INTRATHECAL

## 2021-02-19 MED ORDER — KETOROLAC TROMETHAMINE 30 MG/ML IJ SOLN
30.0000 mg | Freq: Four times a day (QID) | INTRAMUSCULAR | Status: AC | PRN
  Administered 2021-02-20 (×2): 30 mg via INTRAVENOUS
  Filled 2021-02-19: qty 1

## 2021-02-19 MED ORDER — ACETAMINOPHEN 10 MG/ML IV SOLN
1000.0000 mg | Freq: Once | INTRAVENOUS | Status: DC | PRN
  Administered 2021-02-19: 1000 mg via INTRAVENOUS

## 2021-02-19 MED ORDER — KETOROLAC TROMETHAMINE 30 MG/ML IJ SOLN: 30.0000 mg | Freq: Once | INTRAMUSCULAR | Status: AC | PRN

## 2021-02-19 MED ORDER — SOD CITRATE-CITRIC ACID 500-334 MG/5ML PO SOLN
30.0000 mL | Freq: Once | ORAL | Status: AC
  Administered 2021-02-19: 30 mL via ORAL
  Filled 2021-02-19: qty 30

## 2021-02-19 MED ORDER — CLINDAMYCIN PHOSPHATE 900 MG/50ML IV SOLN
900.0000 mg | INTRAVENOUS | Status: AC
  Administered 2021-02-19: 900 mg via INTRAVENOUS

## 2021-02-19 MED ORDER — DIPHENHYDRAMINE HCL 50 MG/ML IJ SOLN
INTRAMUSCULAR | Status: AC
  Filled 2021-02-19: qty 1

## 2021-02-19 MED ORDER — SCOPOLAMINE 1 MG/3DAYS TD PT72: 1.0000 | MEDICATED_PATCH | Freq: Once | TRANSDERMAL | Status: DC

## 2021-02-19 MED ORDER — DEXAMETHASONE SODIUM PHOSPHATE 4 MG/ML IJ SOLN
INTRAMUSCULAR | Status: DC | PRN
  Administered 2021-02-19: 4 mg via INTRAVENOUS

## 2021-02-19 MED ORDER — PHENYLEPHRINE HCL-NACL 20-0.9 MG/250ML-% IV SOLN
INTRAVENOUS | Status: DC | PRN
  Administered 2021-02-19: 60 ug/min via INTRAVENOUS

## 2021-02-19 MED ORDER — METOCLOPRAMIDE HCL 5 MG/ML IJ SOLN
INTRAMUSCULAR | Status: DC | PRN
  Administered 2021-02-19: 10 mg via INTRAVENOUS

## 2021-02-19 MED ORDER — MORPHINE SULFATE (PF) 0.5 MG/ML IJ SOLN
INTRAMUSCULAR | Status: AC
  Filled 2021-02-19: qty 10

## 2021-02-19 SURGICAL SUPPLY — 37 items
ADH SKN CLS APL DERMABOND .7 (GAUZE/BANDAGES/DRESSINGS)
APL SKNCLS STERI-STRIP NONHPOA (GAUZE/BANDAGES/DRESSINGS) ×1
BENZOIN TINCTURE PRP APPL 2/3 (GAUZE/BANDAGES/DRESSINGS) ×2 IMPLANT
CHLORAPREP W/TINT 26ML (MISCELLANEOUS) ×2 IMPLANT
CLAMP CORD UMBIL (MISCELLANEOUS) IMPLANT
CLOTH BEACON ORANGE TIMEOUT ST (SAFETY) ×2 IMPLANT
DERMABOND ADVANCED (GAUZE/BANDAGES/DRESSINGS)
DERMABOND ADVANCED .7 DNX12 (GAUZE/BANDAGES/DRESSINGS) IMPLANT
DRSG OPSITE POSTOP 4X10 (GAUZE/BANDAGES/DRESSINGS) ×2 IMPLANT
ELECT REM PT RETURN 9FT ADLT (ELECTROSURGICAL) ×2
ELECTRODE REM PT RTRN 9FT ADLT (ELECTROSURGICAL) ×1 IMPLANT
EXTRACTOR VACUUM KIWI (MISCELLANEOUS) IMPLANT
GLOVE BIO SURGEON STRL SZ 6 (GLOVE) ×2 IMPLANT
GLOVE BIOGEL PI IND STRL 6 (GLOVE) ×2 IMPLANT
GLOVE BIOGEL PI IND STRL 7.0 (GLOVE) ×1 IMPLANT
GLOVE BIOGEL PI INDICATOR 6 (GLOVE) ×2
GLOVE BIOGEL PI INDICATOR 7.0 (GLOVE) ×1
GOWN STRL REUS W/TWL LRG LVL3 (GOWN DISPOSABLE) ×4 IMPLANT
KIT ABG SYR 3ML LUER SLIP (SYRINGE) ×2 IMPLANT
NDL HYPO 25X5/8 SAFETYGLIDE (NEEDLE) ×1 IMPLANT
NEEDLE HYPO 25X5/8 SAFETYGLIDE (NEEDLE) ×2 IMPLANT
NS IRRIG 1000ML POUR BTL (IV SOLUTION) ×2 IMPLANT
PACK C SECTION WH (CUSTOM PROCEDURE TRAY) ×2 IMPLANT
PAD OB MATERNITY 4.3X12.25 (PERSONAL CARE ITEMS) ×2 IMPLANT
PENCIL SMOKE EVAC W/HOLSTER (ELECTROSURGICAL) ×2 IMPLANT
STRIP CLOSURE SKIN 1/2X4 (GAUZE/BANDAGES/DRESSINGS) IMPLANT
STRIP CLOSURE SKIN 1/4X4 (GAUZE/BANDAGES/DRESSINGS) ×1 IMPLANT
SUT CHROMIC 0 CTX 36 (SUTURE) ×6 IMPLANT
SUT MON AB 2-0 CT1 27 (SUTURE) ×2 IMPLANT
SUT PDS AB 0 CT1 27 (SUTURE) IMPLANT
SUT PLAIN 0 NONE (SUTURE) IMPLANT
SUT PLAIN 2 0 XLH (SUTURE) ×1 IMPLANT
SUT VIC AB 0 CT1 36 (SUTURE) IMPLANT
SUT VIC AB 4-0 KS 27 (SUTURE) IMPLANT
TOWEL OR 17X24 6PK STRL BLUE (TOWEL DISPOSABLE) ×2 IMPLANT
TRAY FOLEY W/BAG SLVR 14FR LF (SET/KITS/TRAYS/PACK) IMPLANT
WATER STERILE IRR 1000ML POUR (IV SOLUTION) ×2 IMPLANT

## 2021-02-19 NOTE — Transfer of Care (Signed)
Immediate Anesthesia Transfer of Care Note  Patient: Diane Guerrero  Procedure(s) Performed: PRIMARY CESAREAN SECTION EDC: 03-10-21 ALLERG: SUPRAX, PEANUT OIL, FIRE ANT  Patient Location: PACU  Anesthesia Type:Spinal  Level of Consciousness: awake, alert  and oriented  Airway & Oxygen Therapy: Patient Spontanous Breathing  Post-op Assessment: Report given to RN and Post -op Vital signs reviewed and stable  Post vital signs: Reviewed and stable  Last Vitals:  Vitals Value Taken Time  BP 133/73 02/19/21 2302  Temp    Pulse 68 02/19/21 2304  Resp 19 02/19/21 2304  SpO2 98 % 02/19/21 2304  Vitals shown include unvalidated device data.  Last Pain:  Vitals:   02/19/21 1905  TempSrc:   PainSc: 0-No pain         Complications: No notable events documented.

## 2021-02-19 NOTE — Op Note (Signed)
PROCEDURE DATE: Primary C section   PREOPERATIVE DIAGNOSIS: A2GDM, BPP 4/8 at 37 wga   POSTOPERATIVE DIAGNOSIS: The same   PROCEDURE:    Primary Low Transverse Cesarean Section   SURGEON:  Dr. Alpha Gula   INDICATIONS: This is a 26yo G1P0 at [redacted]w[redacted]d wga requiring cesarean section secondary to BPP in office of 4/8.   Decision made to proceed with LTCS. The risks of cesarean section discussed with the patient included but were not limited to: bleeding which may require transfusion or reoperation; infection which may require antibiotics; injury to bowel, bladder, ureters or other surrounding organs; injury to the fetus; need for additional procedures including hysterectomy in the event of a life-threatening hemorrhage; placental abnormalities wth subsequent pregnancies, incisional problems, thromboembolic phenomenon and other postoperative/anesthesia complications. The patient agreed with the proposed plan, giving informed consent for the procedure.     FINDINGS:  Viable female infant in vertex presentation, APGARs8, 9,  Weight pending, Amniotic fluid clear,  Intact placenta, three vessel cord.  Grossly normal uterus  .   ANESTHESIA:    Epidural ESTIMATED BLOOD LOSS: 395 cc SPECIMENS: Placenta for path (abnormal BPP) COMPLICATIONS: None immediate    PROCEDURE IN DETAIL:  The patient received intravenous antibiotics (gent/clinda) and had sequential compression devices applied to her lower extremities while in the preoperative area.  She was then taken to the operating room where epidural anesthesia was dosed up to surgical level and was found to be adequate. She was then placed in a dorsal supine position with a leftward tilt, and prepped and draped in a sterile manner.  A foley catheter was placed into her bladder and attached to constant gravity.  After an adequate timeout was performed, a Pfannenstiel skin incision was made with scalpel and carried through to the underlying layer of fascia. The  fascia was incised in the midline and this incision was extended bilaterally using the Mayo scissors. Kocher clamps were applied to the superior aspect of the fascial incision and the underlying rectus muscles were dissected off bluntly. A similar process was carried out on the inferior aspect of the facial incision. The rectus muscles were separated in the midline bluntly and the peritoneum was entered bluntly.  A bladder flap was created sharply and developed bluntly. A transverse hysterotomy was made with a scalpel and extended bilaterally bluntly. The bladder blade was then removed. The infant was successfully delivered, and cord was clamped and cut and infant was handed over to awaiting neonatology team. Uterine massage was then administered and the placenta delivered intact with three-vessel cord. The uterus was cleared of clot and debris.  The hysterotomy was closed with 0 vicryl.  A second imbricating suture of 0-vicryl was used to reinforce the incision and aid in hemostasis.The fascia was closed with 0-Vicryl in a running fashion with good restoration of anatomy.  The subcutaneus tissue was irrigated and was reapproximated using running plain gut stitches.  The skin was closed with 4-0 Vicryl in a subcuticular fashion.  All surgical site and was hemostatic at end of procedure) without any further bleeding on exam.    Pt tolerated the procedure well. All sponge/lap/needle counts were correct  X 2. Pt taken to recovery room in stable condition.     Alpha Gula MD

## 2021-02-19 NOTE — Patient Instructions (Signed)
Trudi Morgenthaler  02/19/2021   Your procedure is scheduled on:  03/05/2021  Arrive at 1000 at Entrance C on [address] at [facility]  and [employer] [employer] [address]. You are invited to use the FREE valet parking or use the Visitor's parking deck.  Pick up the phone at the desk and dial [phone].  Call this number if you have problems the morning of surgery: [phone]  Remember:   Do not eat food:(After Midnight) Desps de medianoche.  Do not drink clear liquids: (After Midnight) Desps de medianoche.  Take these medicines the morning of surgery with A SIP OF WATER:  none   Do not wear jewelry, make-up or nail polish.  Do not wear lotions, powders, or perfumes. Do not wear deodorant.  Do not shave 48 hours prior to surgery.  Do not bring valuables to the hospital.  [facility] is not   responsible for any belongings or valuables brought to the hospital.  Contacts, dentures or bridgework may not be worn into surgery.  Leave suitcase in the car. After surgery it may be brought to your room.  For patients admitted to the hospital, checkout time is 11:00 AM the day of              discharge.      Please read over the following fact sheets that you were given:     Preparing for Surgery

## 2021-02-19 NOTE — Anesthesia Procedure Notes (Signed)
Spinal  Patient location during procedure: OR Start time: 02/19/2021 9:25 PM End time: 02/19/2021 9:28 PM Reason for block: surgical anesthesia Staffing Performed: anesthesiologist  Anesthesiologist: Freddrick March, MD Preanesthetic Checklist Completed: patient identified, IV checked, risks and benefits discussed, surgical consent, monitors and equipment checked, pre-op evaluation and timeout performed Spinal Block Patient position: sitting Prep: DuraPrep and site prepped and draped Patient monitoring: cardiac monitor, continuous pulse ox and blood pressure Approach: midline Location: L3-4 Injection technique: single-shot Needle Needle type: Pencan  Needle gauge: 24 G Needle length: 9 cm Assessment Sensory level: T6 Events: CSF return Additional Notes Functioning IV was confirmed and monitors were applied. Sterile prep and drape, including hand hygiene and sterile gloves were used. The patient was positioned and the spine was prepped. The skin was anesthetized with lidocaine.  Free flow of clear CSF was obtained prior to injecting local anesthetic into the CSF.  The spinal needle aspirated freely following injection.  The needle was carefully withdrawn.  The patient tolerated the procedure well.

## 2021-02-19 NOTE — Anesthesia Preprocedure Evaluation (Addendum)
Anesthesia Evaluation  Patient identified by MRN, date of birth, ID band Patient awake    Reviewed: Allergy & Precautions, NPO status , Patient's Chart, lab work & pertinent test results  Airway Mallampati: III  TM Distance: >3 FB Neck ROM: Full    Dental no notable dental hx. (+) Teeth Intact, Dental Advisory Given   Pulmonary asthma ,    Pulmonary exam normal breath sounds clear to auscultation       Cardiovascular negative cardio ROS Normal cardiovascular exam Rhythm:Regular Rate:Normal     Neuro/Psych  Headaches, Seizures -, Well Controlled,  PSYCHIATRIC DISORDERS Anxiety    GI/Hepatic negative GI ROS, Neg liver ROS,   Endo/Other  diabetes, GestationalMorbid obesity (BMI 44)  Renal/GU negative Renal ROS  negative genitourinary   Musculoskeletal negative musculoskeletal ROS (+)   Abdominal (+) + obese,   Peds  Hematology negative hematology ROS (+)   Anesthesia Other Findings   Reproductive/Obstetrics (+) Pregnancy                           Anesthesia Physical Anesthesia Plan  ASA: 3  Anesthesia Plan: Spinal   Post-op Pain Management:    Induction:   PONV Risk Score and Plan: Treatment may vary due to age or medical condition  Airway Management Planned: Natural Airway  Additional Equipment:   Intra-op Plan:   Post-operative Plan:   Informed Consent: I have reviewed the patients History and Physical, chart, labs and discussed the procedure including the risks, benefits and alternatives for the proposed anesthesia with the patient or authorized representative who has indicated his/her understanding and acceptance.     Dental advisory given  Plan Discussed with: CRNA  Anesthesia Plan Comments:         Anesthesia Quick Evaluation

## 2021-02-19 NOTE — H&P (Signed)
OB History and Physical   Diane Guerrero is a 26 y.o. female G1P0 presenting for abnormal BPP in office with score of 4/8 at [redacted]w[redacted]d.  Pregnancy is complicated by E8BTD on glyburide, history of seizure disorder (on no medication or recent seizures), anxiety.  She was undergoing antenatal testing for A2GDM and had recent BPP 8/8 on 1/27, but fluid subjectively decreased.  She returned today in office for repeat BPP and was noted to be 4/8 (points off for breathing and only 1x gross movement seen, AFI was 10.4 cm).  She does report sensing decreased fetal movement for several days, including today. Denies bleeding, LOF, ctx.   She is Rh negative (Rhogam 12/19/20), GBS negative, panorama low risk female.   She most recently ate at 11AM.  OB History     Gravida  1   Para  0   Term  0   Preterm  0   AB  0   Living         SAB  0   IAB  0   Ectopic  0   Multiple      Live Births             Past Medical History:  Diagnosis Date   Anxiety    Asthma    Complex partial seizure ([place])    Gestational diabetes    Hyperglycemia    Obesity    POTS (postural orthostatic tachycardia syndrome)    Seasonal allergies    Seizures (HCC)    Syncope and collapse    Thrombocytosis    Past Surgical History:  Procedure Laterality Date   LINGUAL FRENECTOMY  1996   MYRINGOTOMY Bilateral 1996   TYMPANOSTOMY TUBE PLACEMENT Bilateral    Family History: family history includes Allergies in her maternal grandmother and mother; Asthma in her mother; COPD in her paternal uncle; Cancer in her maternal grandfather, maternal grandmother, and paternal grandmother; Diabetes in her paternal grandmother; Emphysema in her maternal grandmother; Heart disease in her maternal grandfather and mother; Lung disease in her maternal grandfather; Lupus in her mother; Rheum arthritis in her mother; Stroke in her mother. Social History:  reports that she has never smoked. She has never used smokeless tobacco.  She reports that she does not currently use alcohol. She reports that she does not use drugs.     Maternal Diabetes: Yes:  Diabetes Type:  Insulin/Medication controlled Genetic Screening: Normal Maternal Ultrasounds/Referrals: Normal Fetal Ultrasounds or other Referrals:  None Maternal Substance Abuse:  No Significant Maternal Medications:  None Significant Maternal Lab Results:  Group B Strep negative, Rh negative Other Comments:  None  Review of Systems History   Blood pressure (!) 165/88, pulse (!) 118, temperature 99.1 F (37.3 C), temperature source Oral, resp. rate 20, height 5\' 8"  (1.727 m), weight 132.3 kg, last menstrual period 05/30/2020, SpO2 98 %. Exam Physical Exam  Gen: alert, well appearing, no distress Chest: nonlabored breathing CV: no peripheral edema Abdomen: soft, nontender Ext: no evidence of DVT  Prenatal labs: ABO, Rh: O/Negative/-- (07/15 0000) Antibody:   Rubella: Immune (07/15 0000) RPR: Nonreactive (07/15 0000)  HBsAg: Negative (07/15 0000)  HIV: Non-reactive (07/15 0000)  GBS: Negative/-- (11/24 0000)   Assessment/Plan: Admit to Labor and Delivery FHT reassuring in MAU.  Discussed BPP score and gestational age of [redacted] weeks, recommend delivery. Patient had plans for elective primary C section (was scheduled for 2/14) and will continue with this plan.  She was counseled on risks/benefits of C  section which include but are not limited to bleeding, infection, damage to nearby organs including bowel, bladder, ureter.  Discussed future pregnancy implications of C section and possible need for additional procedure. BP WNL in office today, patient has severe anxiety and believe transient HTN is situation. Will monitor and obtain additional PIH workup if necessary.  All questions answered, will proceed with delivery as able per OR/anesthesia.     Carlyon Shadow 02/19/2021, 5:05 PM

## 2021-02-19 NOTE — MAU Note (Signed)
Went to  scheduled appt for US/NST.  "Baby wasn't moving as well".  Pt is scheduled for a c/s, dr decided it would be best to just proceed with that. Only did [place]. Has been moving less and less.

## 2021-02-20 ENCOUNTER — Encounter (HOSPITAL_COMMUNITY): Payer: Self-pay | Admitting: Obstetrics and Gynecology

## 2021-02-20 LAB — CBC
HCT: 32.8 % — ABNORMAL LOW (ref 36.0–46.0)
Hemoglobin: 11.2 g/dL — ABNORMAL LOW (ref 12.0–15.0)
MCH: 29.8 pg (ref 26.0–34.0)
MCHC: 34.1 g/dL (ref 30.0–36.0)
MCV: 87.2 fL (ref 80.0–100.0)
Platelets: 320 10*3/uL (ref 150–400)
RBC: 3.76 MIL/uL — ABNORMAL LOW (ref 3.87–5.11)
RDW: 13.7 % (ref 11.5–15.5)
WBC: 19.7 10*3/uL — ABNORMAL HIGH (ref 4.0–10.5)
nRBC: 0 % (ref 0.0–0.2)

## 2021-02-20 LAB — SYPHILIS: RPR W/REFLEX TO RPR TITER AND TREPONEMAL ANTIBODIES, TRADITIONAL SCREENING AND DIAGNOSIS ALGORITHM: RPR Ser Ql: NONREACTIVE

## 2021-02-20 MED ORDER — LACTATED RINGERS IV BOLUS
500.0000 mL | Freq: Once | INTRAVENOUS | Status: AC
  Administered 2021-02-20: 500 mL via INTRAVENOUS

## 2021-02-20 MED ORDER — OXYCODONE HCL 5 MG PO TABS
5.0000 mg | ORAL_TABLET | ORAL | Status: DC | PRN
  Administered 2021-02-20 (×2): 5 mg via ORAL
  Administered 2021-02-21 (×3): 10 mg via ORAL
  Administered 2021-02-21: 5 mg via ORAL
  Administered 2021-02-22 (×2): 10 mg via ORAL
  Filled 2021-02-20: qty 2
  Filled 2021-02-20 (×2): qty 1
  Filled 2021-02-20: qty 2
  Filled 2021-02-20: qty 1
  Filled 2021-02-20 (×3): qty 2

## 2021-02-20 MED ORDER — TETANUS-DIPHTH-ACELL PERTUSSIS 5-2.5-18.5 LF-MCG/0.5 IM SUSY: 0.5000 mL | PREFILLED_SYRINGE | Freq: Once | INTRAMUSCULAR | Status: DC

## 2021-02-20 MED ORDER — DIPHENHYDRAMINE HCL 25 MG PO CAPS: 25.0000 mg | ORAL_CAPSULE | Freq: Four times a day (QID) | ORAL | Status: DC | PRN

## 2021-02-20 MED ORDER — SERTRALINE HCL 50 MG PO TABS
50.0000 mg | ORAL_TABLET | Freq: Every day | ORAL | Status: DC
  Administered 2021-02-20: 50 mg via ORAL
  Filled 2021-02-20 (×2): qty 1

## 2021-02-20 MED ORDER — MENTHOL 3 MG MT LOZG: 1.0000 | LOZENGE | OROMUCOSAL | Status: DC | PRN

## 2021-02-20 MED ORDER — ACETAMINOPHEN 325 MG PO TABS
650.0000 mg | ORAL_TABLET | ORAL | Status: DC | PRN
  Administered 2021-02-21 – 2021-02-22 (×6): 650 mg via ORAL
  Filled 2021-02-20 (×8): qty 2

## 2021-02-20 MED ORDER — COCONUT OIL OIL: 1.0000 "application " | TOPICAL_OIL | Status: DC | PRN

## 2021-02-20 MED ORDER — SIMETHICONE 80 MG PO CHEW
80.0000 mg | CHEWABLE_TABLET | Freq: Three times a day (TID) | ORAL | Status: DC
  Administered 2021-02-20 – 2021-02-22 (×8): 80 mg via ORAL
  Filled 2021-02-20 (×8): qty 1

## 2021-02-20 MED ORDER — SENNOSIDES-DOCUSATE SODIUM 8.6-50 MG PO TABS
2.0000 | ORAL_TABLET | Freq: Every day | ORAL | Status: DC
  Administered 2021-02-20 – 2021-02-22 (×3): 2 via ORAL
  Filled 2021-02-20 (×3): qty 2

## 2021-02-20 MED ORDER — ZOLPIDEM TARTRATE 5 MG PO TABS: 5.0000 mg | ORAL_TABLET | Freq: Every evening | ORAL | Status: DC | PRN

## 2021-02-20 MED ORDER — IBUPROFEN 600 MG PO TABS
600.0000 mg | ORAL_TABLET | Freq: Four times a day (QID) | ORAL | Status: DC
  Administered 2021-02-20 – 2021-02-22 (×9): 600 mg via ORAL
  Filled 2021-02-20 (×9): qty 1

## 2021-02-20 MED ORDER — SIMETHICONE 80 MG PO CHEW: 80.0000 mg | CHEWABLE_TABLET | ORAL | Status: DC | PRN

## 2021-02-20 MED ORDER — WITCH HAZEL-GLYCERIN EX PADS: 1.0000 "application " | MEDICATED_PAD | CUTANEOUS | Status: DC | PRN

## 2021-02-20 MED ORDER — DIBUCAINE (PERIANAL) 1 % EX OINT: 1.0000 "application " | TOPICAL_OINTMENT | CUTANEOUS | Status: DC | PRN

## 2021-02-20 MED ORDER — PRENATAL MULTIVITAMIN CH
1.0000 | ORAL_TABLET | Freq: Every day | ORAL | Status: DC
  Administered 2021-02-20 – 2021-02-22 (×3): 1 via ORAL
  Filled 2021-02-20 (×3): qty 1

## 2021-02-20 MED ORDER — LACTATED RINGERS IV SOLN: INTRAVENOUS | Status: DC

## 2021-02-20 MED ORDER — OXYTOCIN-SODIUM CHLORIDE 30-0.9 UT/500ML-% IV SOLN: 2.5000 [IU]/h | INTRAVENOUS | Status: AC

## 2021-02-20 NOTE — Plan of Care (Signed)
°  Problem: Education: Goal: Knowledge of condition will improve Outcome: Progressing Goal: Individualized Educational Video(s) Outcome: Progressing Goal: Individualized Newborn Educational Video(s) Outcome: Progressing   Problem: Activity: Goal: Will verbalize the importance of balancing activity with adequate rest periods Outcome: Progressing Goal: Ability to tolerate increased activity will improve Outcome: Progressing   Problem: Coping: Goal: Ability to identify and utilize available resources and services will improve Outcome: Progressing

## 2021-02-20 NOTE — Lactation Note (Signed)
This note was copied from a baby's chart.  NICU Lactation Consultation Note  Patient Name: Diane Guerrero RCVEL'F Date: 02/20/2021 Age:27 hours   Subjective Reason for consult: Initial assessment; NICU baby; Maternal endocrine disorder Mother has initiated pumping. Her plan is to pump and bottle feed only. She denies hx breast surgery/trauma and recalls + breast changes in early pregnancy.   Mother has a pump at home for use p d/c.  Objective Infant data: Mother's Current Feeding Choice: Breast Milk and Formula  Infant feeding assessment Scale for Readiness: 2 Scale for Quality: 2    Maternal data: G1P0000  C-Section, Low Transverse Significant Breast History:: + breast changes in pregnancy  Does the patient have breastfeeding experience prior to this delivery?: No  Pumping frequency: q3: drops today as expected  Hx GDM with Glyburide   Pump: DEBP, Personal (Spectra)  Assessment Infant: No data recorded No data recorded  Maternal: Milk volume: Normal   Intervention/Plan Interventions: Breast feeding basics reviewed; Education; "The NICU and Your Baby" book; [employer] brochure; Infant Driven Feeding Algorithm education  Tools: Pump Pump Education: Setup, frequency, and cleaning; Milk Storage  Plan: Consult Status: Follow-up  NICU Follow-up type: New admission follow up; Verify absence of engorgement; Verify onset of copious milk; Maternal D/C visit  Pump q3 and bring any EBM to NICU  Gwynne Edinger 02/20/2021, 9:37 AM

## 2021-02-20 NOTE — Progress Notes (Signed)
Subjective: Postpartum Day 1: Cesarean Delivery Patient reports tolerating PO.  Baby in NICU; off CPAP.  Objective: Vital signs in last 24 hours: Temp:  [98 F (36.7 C)-99.1 F (37.3 C)] 98.2 F (36.8 C) (02/01 0809) Pulse Rate:  [59-118] 82 (02/01 0809) Resp:  [11-25] 18 (02/01 0809) BP: (117-165)/(53-94) 117/56 (02/01 0809) SpO2:  [92 %-100 %] 96 % (02/01 0810) Weight:  [132 kg-132.3 kg] 132.3 kg (01/31 1623)  Physical Exam:  General: alert, cooperative, and appears stated age 26: appropriate Uterine Fundus: firm Incision: healing well, no significant drainage, no dehiscence DVT Evaluation: No evidence of DVT seen on physical exam. Negative Homan's sign. No cords or calf tenderness.  Recent Labs    02/19/21 1738 02/20/21 0507  HGB 12.7 11.2*  HCT 37.0 32.8*    Assessment/Plan: Status post Cesarean section. Doing well postoperatively.  Continue current care.  Linda Hedges 02/20/2021, 9:51 AM

## 2021-02-20 NOTE — Anesthesia Postprocedure Evaluation (Signed)
Anesthesia Post Note  Patient: Diane Guerrero  Procedure(s) Performed: PRIMARY CESAREAN SECTION EDC: 03-10-21 ALLERG: SUPRAX, PEANUT OIL, FIRE ANT     Patient location during evaluation: PACU Anesthesia Type: Spinal Level of consciousness: oriented and awake and alert Pain management: pain level controlled Vital Signs Assessment: post-procedure vital signs reviewed and stable Respiratory status: spontaneous breathing, respiratory function stable and patient connected to nasal cannula oxygen Cardiovascular status: blood pressure returned to baseline and stable Postop Assessment: no headache, no backache and no apparent nausea or vomiting Anesthetic complications: no   No notable events documented.  Last Vitals:  Vitals:   02/20/21 0144 02/20/21 0439  BP:  (!) 127/53  Pulse:  88  Resp:  16  Temp:  36.8 C  SpO2: 96% 97%    Last Pain:  Vitals:   02/20/21 0500  TempSrc:   PainSc: 1    Pain Goal:                   [name] L Rylee Huestis

## 2021-02-21 LAB — SURGICAL PATHOLOGY

## 2021-02-21 NOTE — Progress Notes (Signed)
Nursery let me know that the baby is not ready for circ - eating poorly. Nursery and family would like to hold circ and not do today.

## 2021-02-21 NOTE — Progress Notes (Signed)
Subjective: Postpartum Day 2: Cesarean Delivery - primary elective s/s BPP 4/8 at 11 wga Patient reports tolerating PO, + flatus, and no problems voiding.  Baby at bedside.  Objective: Vital signs in last 24 hours: Temp:  [97.5 F (36.4 C)-98.2 F (36.8 C)] 97.5 F (36.4 C) (02/02 0055) Pulse Rate:  [65-129] 82 (02/02 0059) Resp:  [18-20] 18 (02/02 0059) BP: (97-135)/(56-88) 104/56 (02/02 0059) SpO2:  [96 %-99 %] 99 % (02/01 1944)  Physical Exam:  General: alert, cooperative, and appears stated age 26: appropriate Uterine Fundus: firm Incision: healing well, no significant drainage, no dehiscence DVT Evaluation: No evidence of DVT seen on physical exam. Negative Homan's sign. No cords or calf tenderness.  Recent Labs    02/19/21 1738 02/20/21 0507  HGB 12.7 11.2*  HCT 37.0 32.8*     Assessment/Plan: Status post Cesarean section. Doing well postoperatively.  Continue current care. D/W patient female infant circumcision, risks/benefits reviewed. All questions answered.   Tyson Dense 02/21/2021, 6:37 AM

## 2021-02-21 NOTE — Lactation Note (Addendum)
This note was copied from a baby's chart. Lactation Consultation Note  Patient Name: Diane Guerrero NGEXB'M Date: 02/21/2021 Reason for consult: Follow-up assessment;Mother's request;Early term 37-38.6wks;Breastfeeding assistance Age:27 hours Mom feeding plan is to pump and supplement using EBM first followed by formula. LC reviewed volumes, Mom aware to offer more since infant not going to the breast.   Mom denies any pain with nipples although red in appearance. No signs of nipple trauam noted. At one point, Mom used pump not centered on nipple and left circular mark on the breast. Mom changed into nursing bra from sweatshirt to have better visibility when using the pump.   Mom provided with coconut oil to use before pumping.   Mom following feeding plan of SLP keeping total feeding under 30 min, increasing volume and not letting infant go more than 3 hrs without a feeding.   Plan 1. To feed based on cues 8-12x 24hr period.  2. Mom to supplement with EBM first followed by formula using SLP Dr. Owens Shark preemie nipple.  3 DEBP q 3hrs for 15 min  All questions answered at the end of the visit.   Maternal Data Has patient been taught Hand Expression?: Yes  Feeding Mother's Current Feeding Choice: Breast Milk and Formula Nipple Type: Dr. Clement Husbands  [facility] Score                    Lactation Tools Discussed/Used Tools: Pump;Flanges;Coconut oil Flange Size: 21;24 Breast pump type: Double-Electric Breast Pump Pump Education: Setup, frequency, and cleaning;Milk Storage Reason for Pumping: increase stimulation Pumping frequency: every 3 hrs for 15 min  Interventions Interventions: Breast feeding basics reviewed;Education;Pace feeding;Expressed milk;Infant Driven Feeding Algorithm education;DEBP;Hand express;Coconut oil  Discharge Pump: Personal  Consult Status Consult Status: Follow-up Date: 02/22/21 Follow-up type: In-patient    Arthur Aydelotte   [name] 02/21/2021, 1:52 PM

## 2021-02-21 NOTE — Progress Notes (Signed)
This chaplain responded to RN-Julie's page for Pt. and family spiritual care. The Pt. husband and baby boy are at the Pt. bedside during the visit..  The chaplain understands the Pt. birthing experience was not as the Pt. planned, in addition to day 2 of pain and lack of sleep. The Pt. was able to talk freely and was affirmed by the Pt. husband and the chaplain.  The Pt. shared her interest in new mother resources offered by the hospital and is open to a F/U phone call from [facility]. A blessing was shared with baby boy and family before the chaplain left the room.  Chaplain Sallyanne Kuster [phone]

## 2021-02-22 ENCOUNTER — Encounter (HOSPITAL_COMMUNITY): Payer: Self-pay | Admitting: *Deleted

## 2021-02-22 MED ORDER — IBUPROFEN 600 MG PO TABS: 600.0000 mg | ORAL_TABLET | Freq: Four times a day (QID) | ORAL | 0 refills | Status: DC | PRN

## 2021-02-22 MED ORDER — OXYCODONE HCL 5 MG PO TABS: 5.0000 mg | ORAL_TABLET | ORAL | 0 refills | Status: DC | PRN

## 2021-02-22 NOTE — Discharge Summary (Signed)
Postpartum Discharge Summary       Patient Name: Diane Guerrero DOB: [birth year] MRN: [id number]  Date of admission: 02/19/2021 Delivery date:02/19/2021  Delivering provider: Eyvonne Mechanic A  Date of discharge: 02/22/2021  Admitting diagnosis: Pregnancy [Z34.90] Intrauterine pregnancy: [redacted]w[redacted]d    Secondary diagnosis:  Principal Problem:   Pregnancy  Additional problems: Anxiety, seizure disorder, A2DM    Discharge diagnosis: Term Pregnancy Delivered and Type 2 DM                                              Post partum procedures:   Augmentation: N/A Complications: None  Hospital course: Sceduled C/S   26y.o. yo G1P0000 at 358w5das admitted to the hospital 02/19/2021 for scheduled cesarean section with the following indication:Non-Reassuring FHR.Delivery details are as follows:  Membrane Rupture Time/Date: 9:52 PM ,02/19/2021   Delivery Method:C-Section, Low Transverse  Details of operation can be found in separate operative note.  Patient had an uncomplicated postpartum course.  She is ambulating, tolerating a regular diet, passing flatus, and urinating well. Patient is discharged home in stable condition on  02/22/21        Newborn Data: Birth date:02/19/2021  Birth time:9:53 PM  Gender:Female  Living status:Living  Apgars:8 ,9  Weight:3030 g     Magnesium Sulfate received: No BMZ received: No Rhophylac:N/A MMR:N/A T-DaP:Given prenatally Flu: N/A Transfusion:No  Physical exam  Vitals:   02/21/21 1620 02/21/21 1951 02/22/21 0328 02/22/21 0749  BP:  122/60 117/61 106/65  Pulse:  81 81 78  Resp:  _0 Temp:  98.4 F (36.9 C) 98.6 F (37 C) 98.3 F (36.8 C)  TempSrc:  Oral Oral Oral  SpO2: 98% 98% 99% 99%  Weight:      Height:       General: alert, cooperative, and no distress Lochia: appropriate Uterine Fundus: firm Incision: Healing well with no significant drainage DVT Evaluation: No evidence of DVT seen on physical exam. Labs: Lab Results   Component Value Date   WBC 19.7 (H) 02/20/2021   HGB 11.2 (L) 02/20/2021   HCT 32.8 (L) 02/20/2021   MCV 87.2 02/20/2021   PLT 320 02/20/2021   CMP Latest Ref Rng & Units 01/27/2021  Glucose 70 - 99 mg/dL 101(H)  BUN 6 - 20 mg/dL <5(L)  Creatinine 0.44 - 1.00 mg/dL 0.47  Sodium 135 - 145 mmol/L 134(L)  Potassium 3.5 - 5.1 mmol/L 3.3(L)  Chloride 98 - 111 mmol/L 109  CO2 22 - 32 mmol/L 20(L)  Calcium 8.9 - 10.3 mg/dL 8.9  Total Protein 6.5 - 8.1 g/dL 6.3(L)  Total Bilirubin 0.3 - 1.2 mg/dL 0.8  Alkaline Phos 38 - 126 U/L 115  AST 15 - 41 U/L 14(L)  ALT 0 - 44 U/L 15   Edinburgh Score: Edinburgh Postnatal Depression Scale Screening Tool 02/21/2021  I have been able to laugh and see the funny side of things. 0  I have looked forward with enjoyment to things. 0  I have blamed myself unnecessarily when things went wrong. 2  I have been anxious or worried for no good reason. 3  I have felt scared or panicky for no good reason. 2  Things have been getting on top of me. 1  I have been so unhappy that I have had difficulty sleeping. 0  I have felt  sad or miserable. 0  I have been so unhappy that I have been crying. 1  The thought of harming myself has occurred to me. 0  Edinburgh Postnatal Depression Scale Total 9      After visit meds:     Discharge home in stable condition Infant Feeding: Breast Infant Disposition:home with mother Discharge instruction: per After Visit Summary and Postpartum booklet. Activity: Advance as tolerated. Pelvic rest for 6 weeks.  Diet: carb modified diet Anticipated Birth Control: Unsure Postpartum Appointment:6 weeks Additional Postpartum F/U:    Future Appointments: Future Appointments  Date Time Provider [place]  03/04/2021  9:30 AM [facility] PAT 1 [facility] None   Follow up Visit:      02/22/2021 Luz Lex, MD

## 2021-02-22 NOTE — Lactation Note (Signed)
This note was copied from a baby's chart. Lactation Consultation Note  Patient Name: Diane Guerrero SFKCL'E Date: 02/22/2021 Reason for consult: Follow-up assessment Age:27 hours  P1, Mother states she is still not feeling well enough to pump q 3 hours but plans to once home.  She did pump once last night with volume of approx.  6 ml. She is using 27 flanges which she feels has helped yield more volume.   Discussed increasing volumes for baby. Reviewed engorgement care and monitoring voids/stools.   Feeding Mother's Current Feeding Choice: Breast Milk and Formula Nipple Type: Dr. Roosvelt Harps Preemie  Interventions Interventions: Education;DEBP  Discharge Discharge Education: Engorgement and breast care;Warning signs for feeding baby Pump: Personal (Spectra)  Consult Status Consult Status: Complete Date: 02/23/21    Vivianne Master [facility] 02/22/2021, 8:58 AM

## 2021-02-23 ENCOUNTER — Inpatient Hospital Stay (HOSPITAL_COMMUNITY): Payer: PRIVATE HEALTH INSURANCE

## 2021-02-23 ENCOUNTER — Inpatient Hospital Stay (HOSPITAL_COMMUNITY)
Admission: AD | Admit: 2021-02-23 | Discharge: 2021-02-23 | Disposition: A | Discharge status: Home / Self Care | Payer: PRIVATE HEALTH INSURANCE | Attending: Obstetrics and Gynecology | Admitting: Obstetrics and Gynecology

## 2021-02-23 ENCOUNTER — Encounter (HOSPITAL_COMMUNITY): Payer: Self-pay | Admitting: Obstetrics and Gynecology

## 2021-02-23 ENCOUNTER — Other Ambulatory Visit: Payer: Self-pay

## 2021-02-23 DIAGNOSIS — Z98891 History of uterine scar from previous surgery: Secondary | ICD-10-CM | POA: Insufficient documentation

## 2021-02-23 DIAGNOSIS — F418 Other specified anxiety disorders: Secondary | ICD-10-CM | POA: Insufficient documentation

## 2021-02-23 DIAGNOSIS — G8918 Other acute postprocedural pain: Secondary | ICD-10-CM | POA: Diagnosis not present

## 2021-02-23 DIAGNOSIS — O99345 Other mental disorders complicating the puerperium: Secondary | ICD-10-CM | POA: Diagnosis not present

## 2021-02-23 LAB — CBC
HCT: 31.6 % — ABNORMAL LOW (ref 36.0–46.0)
Hemoglobin: 10.9 g/dL — ABNORMAL LOW (ref 12.0–15.0)
MCH: 30.7 pg (ref 26.0–34.0)
MCHC: 34.5 g/dL (ref 30.0–36.0)
MCV: 89 fL (ref 80.0–100.0)
Platelets: 335 10*3/uL (ref 150–400)
RBC: 3.55 MIL/uL — ABNORMAL LOW (ref 3.87–5.11)
RDW: 13.5 % (ref 11.5–15.5)
WBC: 10 10*3/uL (ref 4.0–10.5)
nRBC: 0 % (ref 0.0–0.2)

## 2021-02-23 MED ORDER — MISOPROSTOL 200 MCG PO TABS: 800.0000 ug | ORAL_TABLET | Freq: Once | ORAL | 1 refills | Status: DC

## 2021-02-23 NOTE — MAU Provider Note (Signed)
History     CSN: [id number]  Arrival date and time: 02/23/21 1022   Event Date/Time   First Provider Initiated Contact with Patient 02/23/21 1102      Chief Complaint  Patient presents with   Vaginal Bleeding   Incisional Pain   HPI Diane Guerrero is a 26 y.o. G1P1001 post-op patient who presents to MAU with chief complaint of bleeding. She is s/p primary cesarean on 02/19/2021 with hospital discharge yesterday 02/22/2021.  Patient states she was caring for the baby overnight when she experienced new onset vaginal bleeding. She endorses passing both bright and dark red clots throughout the day today.  Of note, patient states she had no pain with inpatient fundal checks and no lochia while inpatient.  Patient also complains of incisional pain, onset with hospital discharge.  She is intermittently taking the pain medication prescribed at hospital discharge but is hesitant to take it on a regular basis.  She denies abdominal tenderness, dysuria, fever. She receives care with [facility].  OB History     Gravida  1   Para  1   Term  1   Preterm  0   AB  0   Living  1      SAB  0   IAB  0   Ectopic  0   Multiple      Live Births  1           Past Medical History:  Diagnosis Date   Anxiety    Asthma    Complex partial seizure ([place])    Gestational diabetes    Hyperglycemia    Obesity    POTS (postural orthostatic tachycardia syndrome)    Seasonal allergies    Seizures (HCC)    Syncope and collapse    Thrombocytosis     Past Surgical History:  Procedure Laterality Date   CESAREAN SECTION N/A 02/19/2021   Procedure: PRIMARY CESAREAN SECTION EDC: 03-10-21 ALLERG: SUPRAX, PEANUT OIL, FIRE ANT;  Surgeon: Carlyon Shadow, MD;  Location: [facility];  Service: Obstetrics;  Laterality: N/A;   LINGUAL FRENECTOMY  1996   MYRINGOTOMY Bilateral 1996   TYMPANOSTOMY TUBE PLACEMENT Bilateral     Family History  Problem Relation Age of Onset   Allergies  Mother    Asthma Mother    Heart disease Mother    Rheum arthritis Mother    Lupus Mother    Stroke Mother    Emphysema Maternal Grandmother    Allergies Maternal Grandmother    Cancer Maternal Grandmother        bladder   COPD Paternal Uncle    Heart disease Maternal Grandfather    Cancer Maternal Grandfather        lung   Lung disease Maternal Grandfather    Cancer Paternal Grandmother        breast   Diabetes Paternal Grandmother     Social History   Tobacco Use   Smoking status: Never   Smokeless tobacco: Never  Vaping Use   Vaping Use: Never used  Substance Use Topics   Alcohol use: Not Currently   Drug use: Never    Allergies:  Allergies  Allergen Reactions   Fire Ant Hives   Peanut Oil    Suprax [Cefixime]     Hives, itching    Medications Prior to Admission  Medication Sig Dispense Refill Last Dose   EPINEPHrine (EPI-PEN) 0.3 mg/0.3 mL SOAJ injection Inject 0.3 mg into the muscle as  needed.      folic acid (FOLVITE) 1 MG tablet Take 5 mg by mouth daily.      ibuprofen (ADVIL) 600 MG tablet Take 1 tablet (600 mg total) by mouth every 6 (six) hours as needed. 30 tablet 0    oxyCODONE (OXY IR/ROXICODONE) 5 MG immediate release tablet Take 1-2 tablets (5-10 mg total) by mouth every 4 (four) hours as needed for moderate pain. 30 tablet 0    Prenatal Vit-Fe Fumarate-FA (MULTIVITAMIN-PRENATAL) 27-0.8 MG TABS tablet Take 1 tablet by mouth daily at 12 noon.      sertraline (ZOLOFT) 50 MG tablet Take 50 mg by mouth daily.       Review of Systems  Gastrointestinal:  Positive for abdominal pain.  Genitourinary:  Positive for vaginal bleeding.  All other systems reviewed and are negative. Physical Exam   Blood pressure 131/69, pulse 90, temperature 98.4 F (36.9 C), temperature source Oral, resp. rate 16, height 5\' 8"  (1.727 m), weight 131.5 kg, SpO2 99 %, currently breastfeeding.  Physical Exam Vitals and nursing note reviewed. Exam conducted with a chaperone  present.  Constitutional:      Appearance: Normal appearance.  Cardiovascular:     Rate and Rhythm: Normal rate and regular rhythm.     Pulses: Normal pulses.     Heart sounds: Normal heart sounds.  Pulmonary:     Effort: Pulmonary effort is normal.     Breath sounds: Normal breath sounds.  Chest:     Comments: Declines breast exam. Declines pump Abdominal:     Comments: Clean, dry intact honeycomb dressing  Genitourinary:    Comments: Pelvic exam: External genitalia normal, vaginal walls pink and well rugated, cervix visually closed, no lesions noted. Large dark red clot removed with fox swab x 2. Scant active trickle of sanguinous discharge from cervical os.   Skin:    Capillary Refill: Capillary refill takes less than 2 seconds.  Neurological:     General: No focal deficit present.     Mental Status: She is alert and oriented to person, place, and time.  Psychiatric:        Mood and Affect: Mood normal.        Behavior: Behavior normal.        Thought Content: Thought content normal.        Judgment: Judgment normal.    MAU Course/MDM  Procedures   --Reviewed expectations for onset of bleeding, duration and intensity of bleeding s/p Cytotec.   Patient Vitals for the past 24 hrs:  BP Temp Temp src Pulse Resp SpO2 Height Weight  02/23/21 1409 133/78 98.3 F (36.8 C) Oral 89 16 99 % -- --  02/23/21 1406 133/78 -- -- 89 -- -- -- --  02/23/21 1134 -- -- -- -- -- 99 % -- --  02/23/21 1129 -- -- -- -- -- 100 % -- --  02/23/21 1124 -- -- -- -- -- 100 % -- --  02/23/21 1119 -- -- -- -- -- 100 % -- --  02/23/21 1114 -- -- -- -- -- 100 % -- --  02/23/21 1109 -- -- -- -- -- 100 % -- --  02/23/21 1104 -- -- -- -- -- 100 % -- --  02/23/21 1059 -- -- -- -- -- 100 % -- --  02/23/21 1054 -- -- -- -- -- 100 % -- --  02/23/21 1052 131/69 -- -- 90 -- -- -- --  02/23/21 1043 136/79 98.4 F (36.9 C) Oral 91  16 99 % -- --  02/23/21 1039 -- -- -- -- -- -- 5\' 8"  (1.727 m) 131.5 kg    Results for orders placed or performed during the hospital encounter of 02/23/21 (from the past 24 hour(s))  CBC     Status: Abnormal   Collection Time: 02/23/21 10:55 AM  Result Value Ref Range   WBC 10.0 4.0 - 10.5 K/uL   RBC 3.55 (L) 3.87 - 5.11 MIL/uL   Hemoglobin 10.9 (L) 12.0 - 15.0 g/dL   HCT 31.6 (L) 36.0 - 46.0 %   MCV 89.0 80.0 - 100.0 fL   MCH 30.7 26.0 - 34.0 pg   MCHC 34.5 30.0 - 36.0 g/dL   RDW 13.5 11.5 - 15.5 %   Platelets 335 150 - 400 K/uL   nRBC 0.0 0.0 - 0.2 %   Results for orders placed or performed during the hospital encounter of 02/23/21 (from the past 24 hour(s))  CBC     Status: Abnormal   Collection Time: 02/23/21 10:55 AM  Result Value Ref Range   WBC 10.0 4.0 - 10.5 K/uL   RBC 3.55 (L) 3.87 - 5.11 MIL/uL   Hemoglobin 10.9 (L) 12.0 - 15.0 g/dL   HCT 31.6 (L) 36.0 - 46.0 %   MCV 89.0 80.0 - 100.0 fL   MCH 30.7 26.0 - 34.0 pg   MCHC 34.5 30.0 - 36.0 g/dL   RDW 13.5 11.5 - 15.5 %   Platelets 335 150 - 400 K/uL   nRBC 0.0 0.0 - 0.2 %   US PELVIS TRANSVAGINAL NON-OB (TV ONLY)  Result Date: 02/23/2021 CLINICAL DATA:  26 year old female with continued bleeding follow-up C-section delivery 4 days ago. EXAM: ULTRASOUND PELVIS TRANSVAGINAL TECHNIQUE: Transvaginal ultrasound examination of the pelvis was performed including evaluation of the uterus, ovaries, adnexal regions, and pelvic cul-de-sac. COMPARISON:  None. FINDINGS: Uterus Measurements: 14.3 x 7.8 x 10.6 cm = volume: 585 mL. No fibroids or visualized. Endometrium Thickness: 8.5 mm. Within the LOWER uterine canal, 2.6 x 2.6 x 1.2 cm slightly complicated and anechoic area without vascularity noted, with appearance more likely representing blood products/hemorrhage. No focal abnormality or increased echogenicity visualized. Right ovary Measurements: 3 x 1.8 x 1 5 cm = volume: 4.7 mL. Normal appearance/no adnexal mass. Left ovary Measurements: 3 x 2 x 1.5 cm = volume: 4.6 mL. Normal appearance/no adnexal  mass. Other findings:  No abnormal free fluid IMPRESSION: 1. 2.6 x 2.6 x 1.2 cm slightly complicated area within the LOWER uterine canal, indeterminate but more likely blood products/hemorrhage. 2. No other significant abnormalities. Electronically Signed   By: Margarette Canada M.D.   On: 02/23/2021 13:53    Meds ordered this encounter  Medications   misoprostol (CYTOTEC) 200 MCG tablet    Sig: Take 4 tablets (800 mcg total) by mouth once for 1 dose.    Dispense:  4 tablet    Refill:  1    Order Specific Question:   Supervising Provider    Answer:   Radene Gunning [[id number]]    Assessment and Plan  --26 y.o. G1P1001 s/p primary cesarean on 02/19/2021 --Small volume retained products --Hgb 10.9, asymptomatic --Discussed with Dr. Damita Dunnings, will given 800 Cytotec for home administration per patient preference --Incision dressing healing well, no evidence of infection --Discharge home in stable condition  Darlina Rumpf, [place], MSN, CNM 02/23/2021, 2:43 PM

## 2021-02-23 NOTE — MAU Note (Signed)
Diane Guerrero is a 26 y.o. here in MAU reporting: was discharge yesterday, states last night started having increased bleeding. States all night long anytime she got up she would gush blood, seeing clots too. S/p c/s 1/31. Having some pain along her incision.  Onset of complaint: yesterday  Pain score: 6/10  Vitals:   02/23/21 1043  BP: 136/79  Pulse: 91  Resp: 16  Temp: 98.4 F (36.9 C)  SpO2: 99%     Lab orders placed from triage: none

## 2021-02-23 NOTE — Discharge Instructions (Signed)
What to expect after the misoprostol  Cramping, moderate to heavy bleeding, and moderate pain are normal. Most women pass blood clots.  Cramping usually starts one to four hours after take the medicine. Bleeding usually starts between 30 minutes to four hours. However, it can take up to 24 hours for some women. Heavy bleeding and strong cramps usually last between one and four hours. Call or return to the hospital if you soak through more than two large maxi-pads per hour for three hours in a row.  For pain: Resting and using a heating pad or hot water bottle may help. Continue to take the medication you were prescribed when discharged from the hospital. You may use either one or both together.    Other misoprostol side effects:  The following side effects are not dangerous and usually only last one to four hours. If any of these side effects make you very uncomfortable, you may treat your symptoms with over-the-counter medicines. Call if over-the-counter medicines do not relieve your symptoms.  Nausea or vomiting  Call if you vomit several times and cannot hold down liquids. Over the counter treatment: Benadryl 25mg  to 50mg  every six hours as needed, or Dramamine 25mg  to 50mg  every six hours as needed.  Diarrhea  Call if you have several episodes of diarrhea lasting more than four to five hours and you are having trouble drinking liquids (you may be getting dehydrated). Over the counter treatment: Imodium 2 tablets (4mg ) once.  Fever, chills  Misoprostol may cause fever or chills in the first 24 hours. After 24 hours, fever or chills may be a sign of an infection and you should call the clinic. If you already took Tylenol, Vicodin (which has 500mg  of Tylenol in it), or ibuprofen for pain, do not take more of the same medication for fever or chills. Call the clinic if your fever is higher than 101 F (or 39 C). Over the counter treatment: Tylenol 500 to 650mg  every four hours, Ibuprofen  800mg  (four over-the-counter pills) every six to eight hours.  Within a few hours the cramps and bleeding should be much improved

## 2021-03-04 ENCOUNTER — Other Ambulatory Visit (HOSPITAL_COMMUNITY)
Admission: RE | Admit: 2021-03-04 | Discharge: 2021-03-04 | Disposition: A | Discharge status: Home / Self Care | Payer: No Typology Code available for payment source | Source: Ambulatory Visit | Attending: Obstetrics & Gynecology | Admitting: Obstetrics & Gynecology

## 2021-03-04 ENCOUNTER — Telehealth (HOSPITAL_COMMUNITY): Payer: Self-pay | Admitting: *Deleted

## 2021-03-04 HISTORY — DX: Gestational diabetes mellitus in pregnancy, unspecified control: O24.419

## 2021-03-04 NOTE — Telephone Encounter (Signed)
Mom reports feeling good. Incision is still sore, but looks good per mom. No concerns about herself at this time. EPDS=10 [facility] score=9) Dr. Mardelle Matte faxed re: EPDS score. Pt reports on Zoloft, has appt with therapist on Friday, and has joined a mom support grp to help with anxiety and sadness. Mom reports baby is doing well. Feeding, peeing, and pooping without difficulty. Safe sleep reviewed. Mom reports no concerns about baby at present.  Odis Hollingshead, RN 03-04-2021 at 12:10pm

## 2021-03-05 ENCOUNTER — Inpatient Hospital Stay (HOSPITAL_COMMUNITY): Admit: 2021-03-05 | Payer: No Typology Code available for payment source | Admitting: Obstetrics & Gynecology

## 2021-09-16 ENCOUNTER — Ambulatory Visit: Payer: Self-pay

## 2021-11-16 ENCOUNTER — Ambulatory Visit
Admission: RE | Admit: 2021-11-16 | Discharge: 2021-11-16 | Disposition: A | Discharge status: Home / Self Care | Payer: No Typology Code available for payment source | Source: Ambulatory Visit | Attending: Physician Assistant | Admitting: Physician Assistant

## 2021-11-16 VITALS — BP 133/97 | HR 93 | Temp 97.6°F | Resp 15 | Ht 68.0 in | Wt 270.0 lb

## 2021-11-16 DIAGNOSIS — Z1239 Encounter for other screening for malignant neoplasm of breast: Secondary | ICD-10-CM | POA: Diagnosis not present

## 2021-11-16 DIAGNOSIS — R1011 Right upper quadrant pain: Secondary | ICD-10-CM | POA: Diagnosis not present

## 2021-11-16 LAB — CBC WITH DIFFERENTIAL/PLATELET
Abs Immature Granulocytes: 0.03 10*3/uL (ref 0.00–0.07)
Basophils Absolute: 0.1 10*3/uL (ref 0.0–0.1)
Basophils Relative: 0 %
Eosinophils Absolute: 0.2 10*3/uL (ref 0.0–0.5)
Eosinophils Relative: 2 %
HCT: 41.6 % (ref 36.0–46.0)
Hemoglobin: 14.6 g/dL (ref 12.0–15.0)
Immature Granulocytes: 0 %
Lymphocytes Relative: 22 %
Lymphs Abs: 2.6 10*3/uL (ref 0.7–4.0)
MCH: 29.8 pg (ref 26.0–34.0)
MCHC: 35.1 g/dL (ref 30.0–36.0)
MCV: 84.9 fL (ref 80.0–100.0)
Monocytes Absolute: 0.7 10*3/uL (ref 0.1–1.0)
Monocytes Relative: 6 %
Neutro Abs: 8.4 10*3/uL — ABNORMAL HIGH (ref 1.7–7.7)
Neutrophils Relative %: 70 %
Platelets: 421 10*3/uL — ABNORMAL HIGH (ref 150–400)
RBC: 4.9 MIL/uL (ref 3.87–5.11)
RDW: 12.6 % (ref 11.5–15.5)
WBC: 11.9 10*3/uL — ABNORMAL HIGH (ref 4.0–10.5)
nRBC: 0 % (ref 0.0–0.2)

## 2021-11-16 LAB — LIPASE, BLOOD: Lipase: 28 U/L (ref 11–51)

## 2021-11-16 NOTE — ED Triage Notes (Signed)
Patient states that she would like to have a breast exam.  Patient states that she feels dense tissue on the side of her right breast.  Patient denies pain in her breasts and no discharge from her breasts.  Patient also states that yesterday she started having RUQ abdominal pain and N/V.  Patient states that her pain and vomiting has resolved.   Patient denies fevers.

## 2021-11-16 NOTE — ED Provider Notes (Signed)
[facility]    CSN: [id number] Arrival date & time: 11/16/21  1250      History   Chief Complaint Chief Complaint  Patient presents with   Breast Problem    right   Abdominal Pain    RUQ    HPI Diane Guerrero is a 26 y.o. female presenting for 2 separate complaints.  First she states that she has felt some firm breast tissue of her right breast and would like a breast exam.  Next, patient reports several month intermittent history of right upper quadrant abdominal pain which radiates to the right shoulder blade and nausea and vomiting.  She reports that she had an episode early this morning from 2 AM to 5 AM with severe right upper quadrant abdominal pain and multiple episodes of vomiting.  The pain and nausea/vomiting have since resolved but she reports feeling a little fatigued.  She says she thinks she has a gallbladder issue but has not had it checked out.  She does not have a PCP.  She has not had any fevers.  Not reporting any chest pain or breathing difficulty.    Medical history significant for anxiety, asthma, seizures, gestational diabetes, obesity, POTS.  Patient reports having a baby 8 months ago.   HPI  Past Medical History:  Diagnosis Date   Anxiety    Asthma    Complex partial seizure ([place])    Gestational diabetes    Hyperglycemia    Obesity    POTS (postural orthostatic tachycardia syndrome)    Seasonal allergies    Seizures ([place])    Syncope and collapse    Thrombocytosis     Patient Active Problem List   Diagnosis Date Noted   Pregnancy 02/19/2021   Rubella immune status not known 02/16/2015   Immunity status testing 02/16/2015   Screening-pulmonary TB 02/16/2015   General counseling and advice on contraceptive management 02/14/2015   Late period 02/14/2015   Asthma    Complex partial seizure ([place])    Obesity    POTS (postural orthostatic tachycardia syndrome)    Thrombocytosis    Hyperglycemia    Headache 06/23/2013   Syncope  05/26/2013   Asthma 04/27/2013   Allergic rhinitis 04/27/2013    Past Surgical History:  Procedure Laterality Date   CESAREAN SECTION N/A 02/19/2021   Procedure: PRIMARY CESAREAN SECTION EDC: 03-10-21 ALLERG: SUPRAX, PEANUT OIL, FIRE ANT;  Surgeon: Carlyon Shadow, MD;  Location: [facility] LD ORS;  Service: Obstetrics;  Laterality: N/A;   LINGUAL FRENECTOMY  1996   MYRINGOTOMY Bilateral 1996   TYMPANOSTOMY TUBE PLACEMENT Bilateral     OB History     Gravida  1   Para  1   Term  1   Preterm  0   AB  0   Living  1      SAB  0   IAB  0   Ectopic  0   Multiple      Live Births  1            Home Medications    Prior to Admission medications   Medication Sig Start Date End Date Taking? Authorizing Provider  EPINEPHrine (EPI-PEN) 0.3 mg/0.3 mL SOAJ injection Inject 0.3 mg into the muscle as needed.    [provider]  folic acid (FOLVITE) 1 MG tablet Take 5 mg by mouth daily.    [provider]  ibuprofen (ADVIL) 600 MG tablet Take 1 tablet (600 mg total) by mouth  every 6 (six) hours as needed. 02/22/21   Louretta Shorten, MD  misoprostol (CYTOTEC) 200 MCG tablet Take 4 tablets (800 mcg total) by mouth once for 1 dose. 02/23/21 02/23/21  Darlina Rumpf, CNM  Prenatal Vit-Fe Fumarate-FA (MULTIVITAMIN-PRENATAL) 27-0.8 MG TABS tablet Take 1 tablet by mouth daily at 12 noon.    [provider]  sertraline (ZOLOFT) 50 MG tablet Take 50 mg by mouth daily. 01/16/21   [provider]    Family History Family History  Problem Relation Age of Onset   Allergies Mother    Asthma Mother    Heart disease Mother    Rheum arthritis Mother    Lupus Mother    Stroke Mother    Emphysema Maternal Grandmother    Allergies Maternal Grandmother    Cancer Maternal Grandmother        bladder   COPD Paternal Uncle    Heart disease Maternal Grandfather    Cancer Maternal Grandfather        lung   Lung disease Maternal Grandfather    Cancer Paternal  Grandmother        breast   Diabetes Paternal Grandmother     Social History Social History   Tobacco Use   Smoking status: Never   Smokeless tobacco: Never  Vaping Use   Vaping Use: Never used  Substance Use Topics   Alcohol use: Not Currently   Drug use: Never     Allergies   Fire ant, Peanut oil, and Suprax [cefixime]   Review of Systems Review of Systems  Constitutional:  Positive for fatigue. Negative for appetite change and fever.  Respiratory:  Negative for shortness of breath.   Cardiovascular:  Negative for chest pain.  Gastrointestinal:  Positive for abdominal pain, nausea and vomiting. Negative for blood in stool, constipation and diarrhea.  Skin:  Negative for color change, rash and wound.  Neurological:  Negative for weakness.  Hematological:  Negative for adenopathy.     Physical Exam Triage Vital Signs ED Triage Vitals  Enc Vitals Group     BP 11/16/21 1326 (!) 133/97     Pulse Rate 11/16/21 1326 93     Resp 11/16/21 1326 15     Temp 11/16/21 1326 97.6 F (36.4 C)     Temp Source 11/16/21 1326 Oral     SpO2 11/16/21 1326 96 %     Weight 11/16/21 1322 270 lb (122.5 kg)     Height 11/16/21 1322 '5\' 8"'$  (1.727 m)     Head Circumference --      Peak Flow --      Pain Score 11/16/21 1322 0     Pain Loc --      Pain Edu? --      Excl. in [place]? --    No data found.  Updated Vital Signs BP (!) 133/97 (BP Location: Right Arm)   Pulse 93   Temp 97.6 F (36.4 C) (Oral)   Resp 15   Ht '5\' 8"'$  (1.727 m)   Wt 270 lb (122.5 kg)   LMP 10/24/2021 (Exact Date)   SpO2 96%   Breastfeeding No   BMI 41.05 kg/m      Physical Exam Vitals and nursing note reviewed.  Constitutional:      General: She is not in acute distress.    Appearance: Normal appearance. She is well-developed. She is not ill-appearing or toxic-appearing.  HENT:     Head: Normocephalic and atraumatic.  Nose: Nose normal.     Mouth/Throat:     Mouth: Mucous membranes are moist.      Pharynx: Oropharynx is clear.  Eyes:     General: No scleral icterus.       Right eye: No discharge.        Left eye: No discharge.     Conjunctiva/sclera: Conjunctivae normal.  Cardiovascular:     Rate and Rhythm: Normal rate and regular rhythm.     Heart sounds: Normal heart sounds.  Pulmonary:     Effort: Pulmonary effort is normal. No respiratory distress.     Breath sounds: Normal breath sounds.  Chest:  Breasts:    Right: No swelling or tenderness.     Left: No swelling or tenderness.     Comments: Diffuse fibrocystic breast tissue throughout. No discrete masses. Abdominal:     Tenderness: There is no abdominal tenderness.  Musculoskeletal:     Cervical back: Neck supple.  Lymphadenopathy:     Upper Body:     Right upper body: No supraclavicular, axillary or pectoral adenopathy.     Left upper body: No supraclavicular, axillary or pectoral adenopathy.  Skin:    General: Skin is dry.  Neurological:     General: No focal deficit present.     Mental Status: She is alert. Mental status is at baseline.     Motor: No weakness.     Gait: Gait normal.  Psychiatric:        Mood and Affect: Mood normal.        Behavior: Behavior normal.        Thought Content: Thought content normal.      UC Treatments / Results  Labs (all labs ordered are listed, but only abnormal results are displayed) Labs Reviewed  CBC WITH DIFFERENTIAL/PLATELET - Abnormal; Notable for the following components:      Result Value   WBC 11.9 (*)    Platelets 421 (*)    Neutro Abs 8.4 (*)    All other components within normal limits  COMPREHENSIVE METABOLIC PANEL - Abnormal; Notable for the following components:   Sodium 134 (*)    CO2 21 (*)    Glucose, Bld 101 (*)    ALT 48 (*)    Total Bilirubin 1.4 (*)    All other components within normal limits  LIPASE, BLOOD    EKG   Radiology No results found.  Procedures Procedures (including critical care time)  Medications Ordered in  UC Medications - No data to display  Initial Impression / Assessment and Plan / UC Course  I have reviewed the triage vital signs and the nursing notes.  Pertinent labs & imaging results that were available during my care of the patient were reviewed by me and considered in my medical decision making (see chart for details).   26 year old female presents for multiple complaints.  First she states she has had intermittent right upper quadrant abdominal pain with nausea and vomiting for the past few months.  She believes she has a gallbladder problem but has not been checked for it.  She also would like a breast exam.  Reports that she has noticed thick and dense tissue in her right breast.  It is nontender.  Her grandmother has a history of breast cancer.  Vitals are stable.  Patient overall well-appearing.  On exam abdomen is soft and nontender at this time.  Breast exam reveals thick, dense and fibrocystic breast tissue throughout bilateral breasts without  any discrete masses and no tenderness.  CBC shows elevated WBC count of 11.9.  CMP shows normal AST and slightly elevated ALT of 48.  Pending ALP.  Normal lipase.  We will order outpatient right upper quadrant ultrasound for suspected biliary colic.  If this does show gallstones, will place referral to general surgery for consult.  If no evidence of gallstones, will place GI referral for further evaluation.  If ultrasound shows cholecystitis or other concerning findings may send to emergency department I discussed that with patient.  Advised her that if her pain worsens or she develops a fever she should just go straight to the ED.  She plans to get the ultrasound performed tomorrow.  Advised patient to contact the normal breast center for more information.  She may need a referral but I am unable to place one in the system.  Explained to her that fibrocystic breast tissue is normal but given her family medical history of breast cancer, it may  warrant an ultrasound of her breasts.  If she needs a referral for this we did set her up with a primary care provider in 1 month and she can discuss this with them at that time.   Final Clinical Impressions(s) / UC Diagnoses   Final diagnoses:  Abdominal pain, right upper quadrant  Screening breast examination     Discharge Instructions      -Your white blood cell count was a little elevated but nothing significantly high. - Your kidney and liver function all look good. - Your pancreatic enzyme is normal. - You may get the ultrasound tomorrow.  We will have to contact the radiology clinic through [facility] tomorrow morning to get their schedule but they usually just have people come on over and they work them in.  When I get that result I will call you with it.  I am here all day tomorrow.  Do not eat or drink anything for 4 hours before the ultrasound. - If needed for discomfort take Tylenol. - If you develop a fever or you have a return of the abdominal pain that lasts for several hours and does not get better, go to ER. - I can refer you to general surgery consult after I get the ultrasound result if it looks like you do have a gallbladder issue.  If the gallbladder appears to be normal, I may put a referral into GI for you. - Your breast tissue feels the same throughout.  It appears to be consistent with fibrocystic breast.  Have included information for the breast center below.  Try to call them and see if they can schedule you for an ultrasound.  If they need a doctor's order, whenever you follow-up with your new primary care provider in December, speak with them about ordering you an ultrasound.   [facility] Enter through medical mall for ultrasound  [place] at [facility] [place], [place],  [place]  [address] Main: [phone]     ED Prescriptions   None    PDMP not reviewed this  encounter.   Danton Clap, PA-C 11/16/21 1519

## 2021-11-16 NOTE — Discharge Instructions (Addendum)
-  Your white blood cell count was a little elevated but nothing significantly high. - Your kidney and liver function all look good. - Your pancreatic enzyme is normal. - You may get the ultrasound tomorrow.  We will have to contact the radiology clinic through [facility] tomorrow morning to get their schedule but they usually just have people come on over and they work them in.  When I get that result I will call you with it.  I am here all day tomorrow.  Do not eat or drink anything for 4 hours before the ultrasound. - If needed for discomfort take Tylenol. - If you develop a fever or you have a return of the abdominal pain that lasts for several hours and does not get better, go to ER. - I can refer you to general surgery consult after I get the ultrasound result if it looks like you do have a gallbladder issue.  If the gallbladder appears to be normal, I may put a referral into GI for you. - Your breast tissue feels the same throughout.  It appears to be consistent with fibrocystic breast.  Have included information for the breast center below.  Try to call them and see if they can schedule you for an ultrasound.  If they need a doctor's order, whenever you follow-up with your new primary care provider in December, speak with them about ordering you an ultrasound.   [facility] Out[facility] Radiology Enter through medical mall for ultrasound  [place] at [facility] [place], [place],  [place]  [address] Main: [phone]

## 2021-11-17 ENCOUNTER — Telehealth: Payer: Self-pay | Admitting: Physician Assistant

## 2021-11-17 ENCOUNTER — Ambulatory Visit
Admission: RE | Admit: 2021-11-17 | Discharge: 2021-11-17 | Disposition: A | Discharge status: Home / Self Care | Payer: PRIVATE HEALTH INSURANCE | Source: Ambulatory Visit | Attending: Physician Assistant | Admitting: Physician Assistant

## 2021-11-17 DIAGNOSIS — K8020 Calculus of gallbladder without cholecystitis without obstruction: Secondary | ICD-10-CM

## 2021-11-17 DIAGNOSIS — R1011 Right upper quadrant pain: Secondary | ICD-10-CM | POA: Insufficient documentation

## 2021-11-17 LAB — COMPREHENSIVE METABOLIC PANEL
ALT: 48 U/L — ABNORMAL HIGH (ref 0–44)
AST: 25 U/L (ref 15–41)
Albumin: 4.2 g/dL (ref 3.5–5.0)
Alkaline Phosphatase: 105 U/L (ref 38–126)
Anion gap: 6 (ref 5–15)
BUN: 11 mg/dL (ref 6–20)
CO2: 21 mmol/L — ABNORMAL LOW (ref 22–32)
Calcium: 8.9 mg/dL (ref 8.9–10.3)
Chloride: 107 mmol/L (ref 98–111)
Creatinine, Ser: 0.54 mg/dL (ref 0.44–1.00)
GFR, Estimated: >60 mL/min (ref >60)
Glucose, Bld: 101 mg/dL — ABNORMAL HIGH (ref 70–99)
Potassium: 3.7 mmol/L (ref 3.5–5.1)
Sodium: 134 mmol/L — ABNORMAL LOW (ref 135–145)
Total Bilirubin: 1.4 mg/dL — ABNORMAL HIGH (ref 0.3–1.2)
Total Protein: 7.7 g/dL (ref 6.5–8.1)

## 2021-11-17 NOTE — Telephone Encounter (Signed)
Called patient to discuss results of her ultrasound which does show evidence of cholelithiasis.  No signs of gallbladder inflammation or cholecystitis.  Discussed results with her.  Advised I will place a referral to general surgery as we discussed.  Again, reviewed that if her pain is ever associated with fever or last for several hours and worsen she should go to the ER.  Patient is understanding and agreeable.

## 2021-11-25 ENCOUNTER — Encounter: Payer: Self-pay | Admitting: Surgery

## 2021-11-25 ENCOUNTER — Ambulatory Visit (INDEPENDENT_AMBULATORY_CARE_PROVIDER_SITE_OTHER): Payer: PRIVATE HEALTH INSURANCE | Admitting: Surgery

## 2021-11-25 VITALS — BP 123/82 | HR 86 | Temp 98.8°F | Ht 68.0 in | Wt 274.0 lb

## 2021-11-25 DIAGNOSIS — K8020 Calculus of gallbladder without cholecystitis without obstruction: Secondary | ICD-10-CM | POA: Diagnosis not present

## 2021-11-25 NOTE — Patient Instructions (Addendum)
Call us once you are ready to schedule your surgery.   We Spoke today about having your gallbladder removed. This would be done at [facility] with Dr. Hampton Abbot.  You will most likely be out of work 1 week for this surgery.  If you have FMLA or disability paperwork that needs filled out you may drop this off at our office or this can be faxed to [phone].  You will return after your post-op appointment with a lifting restriction for approximately 2-3 more weeks.  You will be able to eat anything you would like to following surgery. But, start by eating a bland diet and advance this as tolerated. The Gallbladder diet is below, please go as closely by this diet as possible prior to surgery to avoid any further attacks.  Please see the (blue)pre-care form that you have been given today. Our surgery scheduler will call you to verify surgery date and to go over information.   If you have any questions, please call our office.  Laparoscopic Cholecystectomy Laparoscopic cholecystectomy is surgery to remove the gallbladder. The gallbladder is located in the upper right part of the abdomen, behind the liver. It is a storage sac for bile, which is produced in the liver. Bile aids in the digestion and absorption of fats. Cholecystectomy is often done for inflammation of the gallbladder (cholecystitis). This condition is usually caused by a buildup of gallstones (cholelithiasis) in the gallbladder. Gallstones can block the flow of bile, and that can result in inflammation and pain. In severe cases, emergency surgery may be required. If emergency surgery is not required, you will have time to prepare for the procedure. Laparoscopic surgery is an alternative to open surgery. Laparoscopic surgery has a shorter recovery time. Your common bile duct may also need to be examined during the procedure. If stones are found in the common bile duct, they may be removed. LET [facility] CARE PROVIDER KNOW  ABOUT: Any allergies you have. All medicines you are taking, including vitamins, herbs, eye drops, creams, and over-the-counter medicines. Previous problems you or members of your family have had with the use of anesthetics. Any blood disorders you have. Previous surgeries you have had.  Any medical conditions you have. RISKS AND COMPLICATIONS Generally, this is a safe procedure. However, problems may occur, including: Infection. Bleeding. Allergic reactions to medicines. Damage to other structures or organs. A stone remaining in the common bile duct. A bile leak from the cyst duct that is clipped when your gallbladder is removed. The need to convert to open surgery, which requires a larger incision in the abdomen. This may be necessary if your surgeon thinks that it is not safe to continue with a laparoscopic procedure. BEFORE THE PROCEDURE Ask your health care provider about: Changing or stopping your regular medicines. This is especially important if you are taking diabetes medicines or blood thinners. Taking medicines such as aspirin and ibuprofen. These medicines can thin your blood. Do not take these medicines before your procedure if your health care provider instructs you not to. Follow instructions from your health care provider about eating or drinking restrictions. Let your health care provider know if you develop a cold or an infection before surgery. Plan to have someone take you home after the procedure. Ask your health care provider how your surgical site will be marked or identified. You may be given antibiotic medicine to help prevent infection. PROCEDURE To reduce your risk of infection: Your health care team will wash or  sanitize their hands. Your skin will be washed with soap. An IV tube may be inserted into one of your veins. You will be given a medicine to make you fall asleep (general anesthetic). A breathing tube will be placed in your mouth. The surgeon will  make several small cuts (incisions) in your abdomen. A thin, lighted tube (laparoscope) that has a tiny camera on the end will be inserted through one of the small incisions. The camera on the laparoscope will send a picture to a TV screen (monitor) in the operating room. This will give the surgeon a good view inside your abdomen. A gas will be pumped into your abdomen. This will expand your abdomen to give the surgeon more room to perform the surgery. Other tools that are needed for the procedure will be inserted through the other incisions. The gallbladder will be removed through one of the incisions. After your gallbladder has been removed, the incisions will be closed with stitches (sutures), staples, or skin glue. Your incisions may be covered with a bandage (dressing). The procedure may vary among health care providers and hospitals. AFTER THE PROCEDURE Your blood pressure, heart rate, breathing rate, and blood oxygen level will be monitored often until the medicines you were given have worn off. You will be given medicines as needed to control your pain.   This information is not intended to replace advice given to you by your health care provider. Make sure you discuss any questions you have with your health care provider.   Document Released: 01/06/2005 Document Revised: 09/27/2014 Document Reviewed: 08/18/2012 Elsevier Interactive Patient Education 2016 [place] Diet for Gallbladder Conditions A low-fat diet can be helpful if you have pancreatitis or a gallbladder condition. With these conditions, your pancreas and gallbladder have trouble digesting fats. A healthy eating plan with less fat will help rest your pancreas and gallbladder and reduce your symptoms. WHAT DO I NEED TO KNOW ABOUT THIS DIET? Eat a low-fat diet. Reduce your fat intake to less than 20-30% of your total daily calories. This is less than 50-60 g of fat per day. Remember that you need some fat in  your diet. Ask your dietician what your daily goal should be. Choose nonfat and low-fat healthy foods. Look for the words "nonfat," "low fat," or "fat free." As a guide, look on the label and choose foods with less than 3 g of fat per serving. Eat only one serving. Avoid alcohol. Do not smoke. If you need help quitting, talk with your health care provider. Eat small frequent meals instead of three large heavy meals. WHAT FOODS CAN I EAT? Grains Include healthy grains and starches such as potatoes, wheat bread, fiber-rich cereal, and brown rice. Choose whole grain options whenever possible. In adults, whole grains should account for 45-65% of your daily calories.  Fruits and Vegetables Eat plenty of fruits and vegetables. Fresh fruits and vegetables add fiber to your diet. Meats and Other Protein Sources Eat lean meat such as chicken and pork. Trim any fat off of meat before cooking it. Eggs, fish, and beans are other sources of protein. In adults, these foods should account for 10-35% of your daily calories. Dairy Choose low-fat milk and dairy options. Dairy includes fat and protein, as well as calcium.  Fats and Oils Limit high-fat foods such as fried foods, sweets, baked goods, sugary drinks.  Other Creamy sauces and condiments, such as mayonnaise, can add extra fat. Think about whether or not you  need to use them, or use smaller amounts or low fat options. WHAT FOODS ARE NOT RECOMMENDED? High fat foods, such as: [employer]. Ice cream. [place] toast. Sweet rolls. Pizza. Cheese bread. Foods covered with batter, butter, creamy sauces, or cheese. Fried foods. Sugary drinks and desserts. Foods that cause gas or bloating   This information is not intended to replace advice given to you by your health care provider. Make sure you discuss any questions you have with your health care provider.  Gallbladder Eating Plan High blood cholesterol, obesity, a sedentary lifestyle, an unhealthy  diet, and diabetes are risk factors for developing gallstones. If you have a gallbladder condition, you may have trouble digesting fats and tolerating high fat intake. Eating a low-fat diet can help reduce your symptoms and may be helpful before and after having surgery to remove your gallbladder (cholecystectomy). Your health care provider may recommend that you work with a dietitian to help you reduce the amount of fat in your diet. What are tips for following this plan? General guidelines Limit your fat intake to less than 30% of your total daily calories. If you eat around 1,800 calories each day, this means eating less than 60 grams (g) of fat per day. Fat is an important part of a healthy diet. Eating a low-fat diet can make it hard to maintain a healthy body weight. Ask your dietitian how much fat, calories, and other nutrients you need each day. Eat small, frequent meals throughout the day instead of three large meals. Drink at least 8-10 cups (1.9-2.4 L) of fluid a day. Drink enough fluid to keep your urine pale yellow. If you drink alcohol: Limit how much you have to: 0-1 drink a day for women who are not pregnant. 0-2 drinks a day for men. Know how much alcohol is in a drink. In the [place] one drink equals one 12 oz bottle of beer (355 mL), one 5 oz glass of wine (148 mL), or one 1 oz glass of hard liquor (44 mL). Reading food labels  Check nutrition facts on food labels for the amount of fat per serving. Choose foods with less than 3 grams of fat per serving. Shopping Choose nonfat and low-fat healthy foods. Look for the words "nonfat," "low-fat," or "fat-free." Avoid buying processed or prepackaged foods. Cooking Cook using low-fat methods, such as baking, broiling, grilling, or boiling. Cook with small amounts of healthy fats, such as olive oil, grapeseed oil, canola oil, avocado oil, or sunflower oil. What foods are recommended?  All fresh, frozen, or canned fruits and  vegetables. Whole grains. Low-fat or nonfat (skim) milk and yogurt. Lean meat, skinless poultry, fish, eggs, and beans. Low-fat protein supplement powders or drinks. Spices and herbs. The items listed above may not be a complete list of foods and beverages you can eat and drink. Contact a dietitian for more information. What foods are not recommended? High-fat foods. These include baked goods, fast food, fatty cuts of meat, ice cream, french toast, sweet rolls, pizza, cheese bread, foods covered with butter, creamy sauces, or cheese. Fried foods. These include french fries, tempura, battered fish, breaded chicken, fried breads, and sweets. Foods that cause bloating and gas. The items listed above may not be a complete list of foods that you should avoid. Contact a dietitian for more information. Summary A low-fat diet can be helpful if you have a gallbladder condition, or before and after gallbladder surgery. Limit your fat intake to less than 30% of your  total daily calories. This is about 60 g of fat if you eat 1,800 calories each day. Eat small, frequent meals throughout the day instead of three large meals. This information is not intended to replace advice given to you by your health care provider. Make sure you discuss any questions you have with your health care provider. Document Revised: 12/21/2020 Document Reviewed: 12/21/2020 Elsevier Patient Education  [place].

## 2021-11-25 NOTE — Progress Notes (Signed)
11/25/2021  Reason for Visit: Symptomatic cholelithiasis  History of Present Illness: Diane Guerrero is a 26 y.o. female presenting for evaluation of symptomatic cholelithiasis.  Patient reports that she has had a few episodes of biliary colic since this year.  She had a C-section in January 2023 after which she had 2 episodes of right upper quadrant abdominal pain that would radiate towards the back associated with nausea and vomiting.  She had another episode in March that was very similar but also severe.  She talked to a doctor at her work who suggested this could be gallbladder problems and advised her on certain foods to avoid.  She had done well until recently when she had another episode of significant epigastric/right upper quadrant pain radiating to the back and shoulders associated with nausea and vomiting.  She presented to urgent care 28/23.  Laboratory work-up there showed a total bilirubin of 1.4 with AST 25, ALT 48, alkaline phosphatase 105, lipase 28.  White blood cell count was 11.9.  Ultrasound was done showing cholelithiasis with multiple small stones measuring up to 9 mm with no gallbladder wall thickening and a normal common bile duct size.  Since then, she has not had any issues.  She presents today to try to get more information about gallbladders and potential surgery although she is not sure that she wants to have surgery yet.  Past Medical History: Past Medical History:  Diagnosis Date   Anxiety    Asthma    Complex partial seizure ([place])    Gestational diabetes    Hyperglycemia    Obesity    POTS (postural orthostatic tachycardia syndrome)    Seasonal allergies    Seizures ([facility]CC)    Syncope and collapse    Thrombocytosis      Past Surgical History: Past Surgical History:  Procedure Laterality Date   CESAREAN SECTION N/A 02/19/2021   Procedure: PRIMARY CESAREAN SECTION EDC: 03-10-21 ALLERG: SUPRAX, PEANUT OIL, FIRE ANT;  Surgeon: Carlyon Shadow, MD;  Location:  [facility] LD ORS;  Service: Obstetrics;  Laterality: N/A;   LINGUAL FRENECTOMY  1996   MYRINGOTOMY Bilateral 1996   TYMPANOSTOMY TUBE PLACEMENT Bilateral     Home Medications: Prior to Admission medications   Medication Sig Start Date End Date Taking? Authorizing Provider  EPINEPHrine (EPI-PEN) 0.3 mg/0.3 mL SOAJ injection Inject 0.3 mg into the muscle as needed.   Yes [provider]    Allergies: Allergies  Allergen Reactions   Fire Ant Hives   Peanut Oil    Suprax [Cefixime]     Hives, itching    Social History:  reports that she has never smoked. She has never been exposed to tobacco smoke. She has never used smokeless tobacco. She reports that she does not currently use alcohol. She reports that she does not use drugs.   Family History: Family History  Problem Relation Age of Onset   Allergies Mother    Asthma Mother    Heart disease Mother    Rheum arthritis Mother    Lupus Mother    Stroke Mother    Emphysema Maternal Grandmother    Allergies Maternal Grandmother    Cancer Maternal Grandmother        bladder   COPD Paternal Uncle    Heart disease Maternal Grandfather    Cancer Maternal Grandfather        lung   Lung disease Maternal Grandfather    Cancer Paternal Grandmother        breast  Diabetes Paternal Grandmother     Review of Systems: Review of Systems  Constitutional:  Negative for chills and fever.  Cardiovascular:  Negative for chest pain.  Gastrointestinal:  Positive for abdominal pain, nausea and vomiting.  Genitourinary:  Negative for dysuria.  Musculoskeletal:  Negative for myalgias.  Skin:  Negative for rash.    Physical Exam BP 123/82   Pulse 86   Temp 98.8 F (37.1 C)   Ht '5\' 8"'$  (1.727 m)   Wt 274 lb (124.3 kg)   LMP 11/24/2021 (Exact Date)   SpO2 97%   BMI 41.66 kg/m  CONSTITUTIONAL: No acute distress, well-nourished HEENT:  Normocephalic, atraumatic, extraocular motion intact. RESPIRATORY:  Lungs are clear, and breath  sounds are equal bilaterally. Normal respiratory effort without pathologic use of accessory muscles. CARDIOVASCULAR: Heart is regular without murmurs, gallops, or rubs. GI: The abdomen is soft, nondistended, nontender to palpation.  Negative Murphy's sign.  MUSCULOSKELETAL:  Normal muscle strength and tone in all four extremities.  No peripheral edema or cyanosis. NEUROLOGIC:  Motor and sensation is grossly normal.  Cranial nerves are grossly intact. PSYCH:  Alert and oriented to person, place and time. Affect is normal.  Laboratory Analysis: Labs from 11/16/2021: Sodium 134, potassium 3.7, chloride 107, CO2 21, BUN 11, creatinine 0.54.  Total bilirubin 1.4, AST 25, ALT 48, alkaline phosphatase 105, lipase 28.  WBC 11.9, hemoglobin 14.6, hematocrit 41.6, platelets 421.  Imaging: Ultrasound RUQ on 11/17/2021: IMPRESSION: 1. Cholelithiasis without evidence of acute cholecystitis or bile duct obstruction. 2. Echogenic liver suggesting Fatty liver disease.  Assessment and Plan: This is a 26 y.o. female with symptomatic cholelithiasis.  - Discussed with patient how biliary colic occur-stones within the gallbladder are temporarily blocking the cystic duct leading to the episodes of pain associated with nausea and vomiting.  Discussed with her how her stones currently are mobile but there is a chance that the stones could get stuck within the cystic duct opening leading to further inflammatory changes in her gallbladder and acute cholecystitis.  Discussed also with her that there is potential risk for gallstone pancreatitis or choledocholithiasis but the risk of this is smaller.  Discussed with her potential options for low-fat diet and further dietary changes to trigger the gallbladder less and also discussed with her the potential for surgery in a minimally invasive fashion and reviewed with her the surgery at length.  After further discussion, the patient is opted to try for now a low-fat diet and  other dietary changes.  We will give her gallbladder diet information.  She is aware of symptoms to look out for and also return precautions particularly if any of these episodes are more severe or not subsiding on their own.  Discussed with her that his low-fat diet does not help, then we can see her again in the future and try to schedule her for surgery. - Patient understands this plan and all of her questions have been answered. - Follow-up as needed.  I spent 30 minutes dedicated to the care of this patient on the date of this encounter to include pre-visit review of records, face-to-face time with the patient discussing diagnosis and management, and any post-visit coordination of care.   Melvyn Neth, [facility]

## 2021-12-20 ENCOUNTER — Ambulatory Visit: Payer: No Typology Code available for payment source | Admitting: Nurse Practitioner

## 2022-06-29 ENCOUNTER — Ambulatory Visit
Admission: EM | Admit: 2022-06-29 | Discharge: 2022-06-29 | Disposition: A | Discharge status: Home / Self Care | Payer: PRIVATE HEALTH INSURANCE | Attending: Physician Assistant | Admitting: Physician Assistant

## 2022-06-29 DIAGNOSIS — J029 Acute pharyngitis, unspecified: Secondary | ICD-10-CM | POA: Diagnosis present

## 2022-06-29 DIAGNOSIS — H6502 Acute serous otitis media, left ear: Secondary | ICD-10-CM | POA: Insufficient documentation

## 2022-06-29 DIAGNOSIS — H9202 Otalgia, left ear: Secondary | ICD-10-CM | POA: Insufficient documentation

## 2022-06-29 LAB — GROUP A STREP BY PCR: Group A Strep by PCR: NOT DETECTED

## 2022-06-29 NOTE — Discharge Instructions (Addendum)
-  Strep is negative.  -Symptoms likely related to viral illness.  You have some fluid behind your eardrum. - Start Mucinex D, Flonase, ibuprofen, Tylenol, warm compresses.

## 2022-06-29 NOTE — ED Triage Notes (Signed)
Pt c/o left ear pain, loss of hearing, and left side neck pain x3days

## 2022-06-29 NOTE — ED Provider Notes (Signed)
[facility]    CSN: [id number] Arrival date & time: 06/29/22  1005      History   Chief Complaint Chief Complaint  Patient presents with   Ear Problem    HPI Diane Guerrero is a 27 y.o. female presenting for left ear pain, sore throat and left-sided neck pain x 3 days.  She denies fever, cough, congestion, sinus pain.  No drainage from the ear or hearing loss.  Patient reports the ear hurts her the most.  She has taken ibuprofen and used over-the-counter eardrops without relief.  HPI  Past Medical History:  Diagnosis Date   Anxiety    Asthma    Complex partial seizure (HCC)    Gestational diabetes    Hyperglycemia    Obesity    POTS (postural orthostatic tachycardia syndrome)    Seasonal allergies    Seizures (HCC)    Syncope and collapse    Thrombocytosis     Patient Active Problem List   Diagnosis Date Noted   Pregnancy 02/19/2021   Rubella immune status not known 02/16/2015   Immunity status testing 02/16/2015   Screening-pulmonary TB 02/16/2015   General counseling and advice on contraceptive management 02/14/2015   Late period 02/14/2015   Asthma    Complex partial seizure (HCC)    Obesity    POTS (postural orthostatic tachycardia syndrome)    Thrombocytosis    Hyperglycemia    Headache 06/23/2013   Syncope 05/26/2013   Asthma 04/27/2013   Allergic rhinitis 04/27/2013    Past Surgical History:  Procedure Laterality Date   CESAREAN SECTION N/A 02/19/2021   Procedure: PRIMARY CESAREAN SECTION EDC: 03-10-21 ALLERG: SUPRAX, PEANUT OIL, FIRE ANT;  Surgeon: Lyn Henri, MD;  Location: [facility];  Service: Obstetrics;  Laterality: N/A;   LINGUAL FRENECTOMY  1996   MYRINGOTOMY Bilateral 1996   TYMPANOSTOMY TUBE PLACEMENT Bilateral     OB History     Gravida  1   Para  1   Term  1   Preterm  0   AB  0   Living  1      SAB  0   IAB  0   Ectopic  0   Multiple      Live Births  1            Home Medications     Prior to Admission medications   Medication Sig Start Date End Date Taking? Authorizing Provider  EPINEPHrine (EPI-PEN) 0.3 mg/0.3 mL SOAJ injection Inject 0.3 mg into the muscle as needed.   Yes [provider]    Family History Family History  Problem Relation Age of Onset   Allergies Mother    Asthma Mother    Heart disease Mother    Rheum arthritis Mother    Lupus Mother    Stroke Mother    Emphysema Maternal Grandmother    Allergies Maternal Grandmother    Cancer Maternal Grandmother        bladder   COPD Paternal Uncle    Heart disease Maternal Grandfather    Cancer Maternal Grandfather        lung   Lung disease Maternal Grandfather    Cancer Paternal Grandmother        breast   Diabetes Paternal Grandmother     Social History Social History   Tobacco Use   Smoking status: Never    Passive exposure: Never   Smokeless tobacco: Never  Vaping Use  Vaping Use: Never used  Substance Use Topics   Alcohol use: Not Currently   Drug use: Never     Allergies   Fire ant, Peanut oil, and Suprax [cefixime]   Review of Systems Review of Systems  Constitutional:  Negative for chills, diaphoresis, fatigue and fever.  HENT:  Positive for ear pain and sore throat. Negative for congestion, ear discharge, hearing loss, rhinorrhea, sinus pressure and sinus pain.   Respiratory:  Negative for cough.   Gastrointestinal:  Negative for nausea and vomiting.  Musculoskeletal:  Positive for neck pain. Negative for arthralgias and myalgias.  Skin:  Negative for rash.  Neurological:  Negative for dizziness, weakness and headaches.  Hematological:  Negative for adenopathy.     Physical Exam Triage Vital Signs ED Triage Vitals [06/29/22 1020]  Enc Vitals Group     BP      Pulse      Resp      Temp      Temp src      SpO2      Weight 270 lb (122.5 kg)     Height 5\' 8"  (1.727 m)     Head Circumference      Peak Flow      Pain Score 7     Pain Loc       Pain Edu?      Excl. in GC?    No data found.  Updated Vital Signs BP 119/81 (BP Location: Left Arm)   Pulse 74   Temp 98.1 F (36.7 C) (Oral)   Resp 16   Ht 5\' 8"  (1.727 m)   Wt 270 lb (122.5 kg)   LMP 06/18/2022   SpO2 98%   BMI 41.05 kg/m      Physical Exam Vitals and nursing note reviewed.  Constitutional:      General: She is not in acute distress.    Appearance: Normal appearance. She is not ill-appearing or toxic-appearing.  HENT:     Head: Normocephalic and atraumatic.     Right Ear: Tympanic membrane, ear canal and external ear normal.     Left Ear: Ear canal and external ear normal. A middle ear effusion is present. Tympanic membrane is bulging.     Nose: Nose normal.     Mouth/Throat:     Mouth: Mucous membranes are moist.     Pharynx: Oropharynx is clear. Posterior oropharyngeal erythema present.  Eyes:     General: No scleral icterus.       Right eye: No discharge.        Left eye: No discharge.     Conjunctiva/sclera: Conjunctivae normal.  Cardiovascular:     Rate and Rhythm: Normal rate and regular rhythm.     Heart sounds: Normal heart sounds.  Pulmonary:     Effort: Pulmonary effort is normal. No respiratory distress.     Breath sounds: Normal breath sounds.  Musculoskeletal:     Cervical back: Neck supple.  Lymphadenopathy:     Cervical: Cervical adenopathy present.  Skin:    General: Skin is dry.  Neurological:     General: No focal deficit present.     Mental Status: She is alert. Mental status is at baseline.     Motor: No weakness.     Gait: Gait normal.  Psychiatric:        Mood and Affect: Mood normal.        Behavior: Behavior normal.        Thought Content:  Thought content normal.      UC Treatments / Results  Labs (all labs ordered are listed, but only abnormal results are displayed) Labs Reviewed  GROUP A STREP BY PCR    EKG   Radiology No results found.  Procedures Procedures (including critical care  time)  Medications Ordered in UC Medications - No data to display  Initial Impression / Assessment and Plan / UC Course  I have reviewed the triage vital signs and the nursing notes.  Pertinent labs & imaging results that were available during my care of the patient were reviewed by me and considered in my medical decision making (see chart for details).   27 year old female presents for left-sided ear pain, sore throat and neck pain x 3 days.  Denies fever, cough, congestion, drainage from ear.  Taking ibuprofen and using OTC eardrops.  On exam she has clear effusion bulging of the left TM without any erythema.  Ear canals clear.  Also has erythema of posterior pharynx.  Enlarged left anterior cervical lymph node.  Remainder of exam is within normal limits.  PCR strep test performed.  Negative.  Reviewed results with patient.  Suspected viral illness.  Advised Mucinex D, Flonase, ibuprofen, Tylenol, warm compresses.  Reviewed return precautions.   Final Clinical Impressions(s) / UC Diagnoses   Final diagnoses:  Left ear pain  Acute pharyngitis, unspecified etiology  Acute serous otitis media of left ear, recurrence not specified     Discharge Instructions      -Strep is negative.  -Symptoms likely related to viral illness.  You have some fluid behind your eardrum. - Start Mucinex D, Flonase, ibuprofen, Tylenol, warm compresses.      ED Prescriptions   None    PDMP not reviewed this encounter.   Shirlee Latch, PA-C 06/29/22 1114

## 2022-07-08 IMAGING — US US TRANSVAGINAL NON-OB
1 series · 15 of 19 positions shown · non-contrast
Comparison: None.

CLINICAL DATA: 25-year-old female with continued bleeding follow-up
C-section delivery 4 days ago.

EXAM:
ULTRASOUND PELVIS TRANSVAGINAL
TECHNIQUE: Transvaginal ultrasound examination of the pelvis was performed
including evaluation of the uterus, ovaries, adnexal regions, and
pelvic cul-de-sac.

[Series 1: us transvaginal non-ob · 15 of 19 slices shown]
[im 1/19]
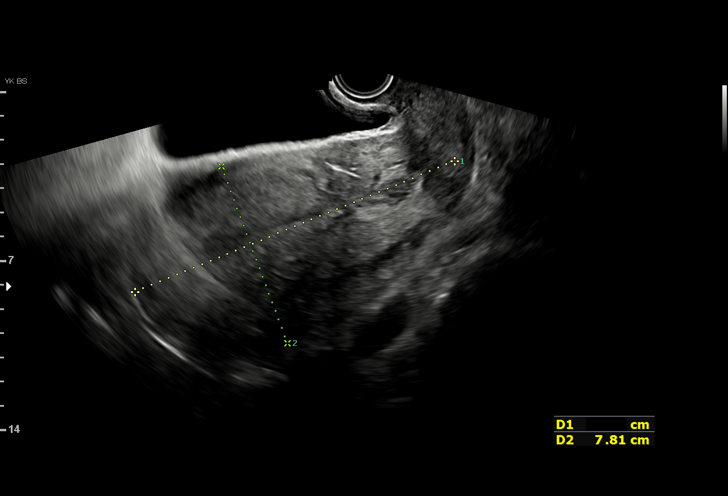
[im 2/19]
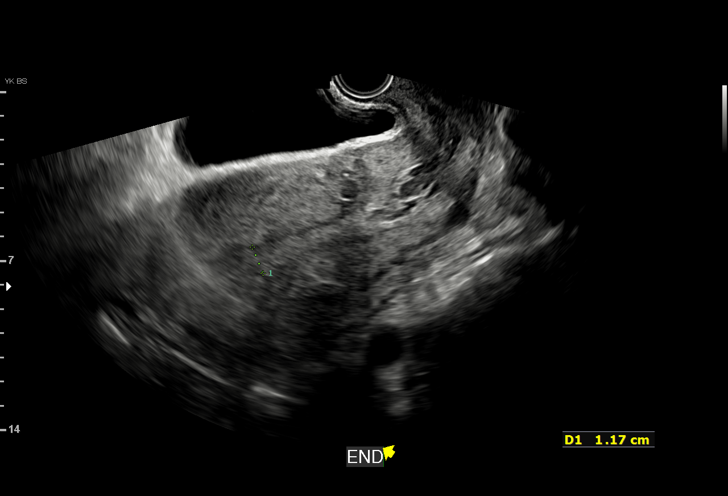
[im 4/19]
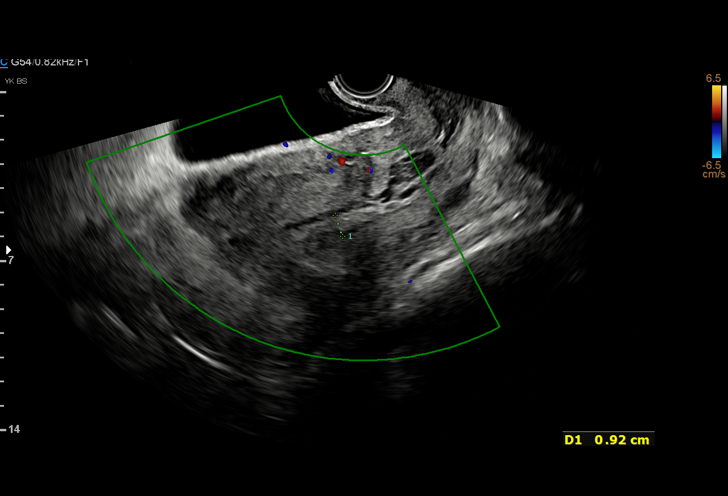
[im 5/19]
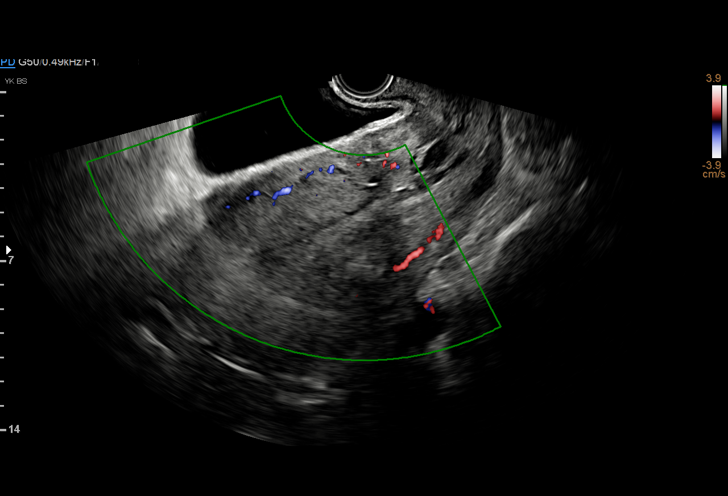
[im 6/19]
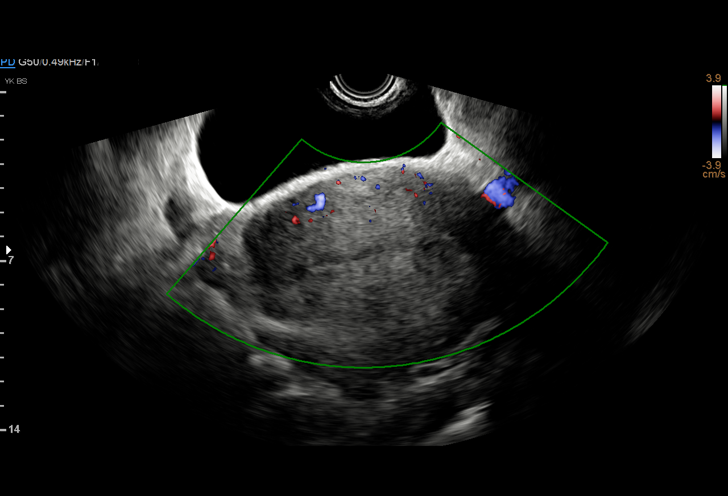
[im 7/19]
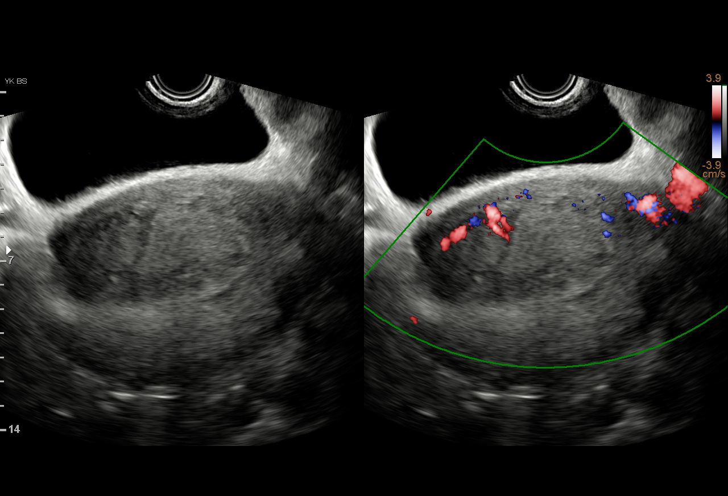
[im 9/19]
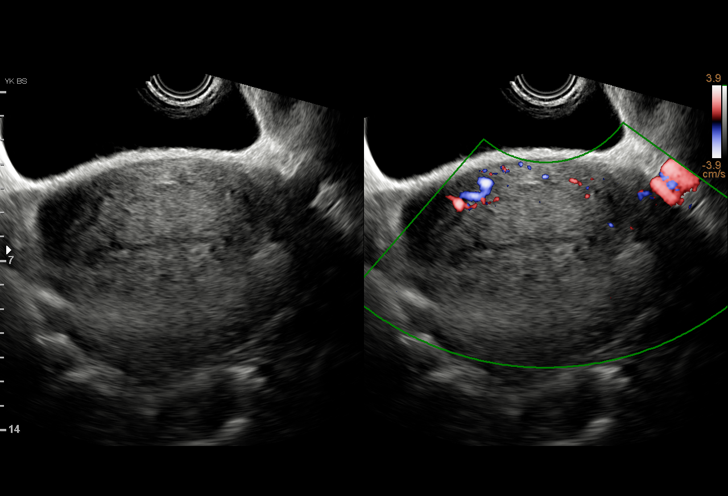
[im 10/19]
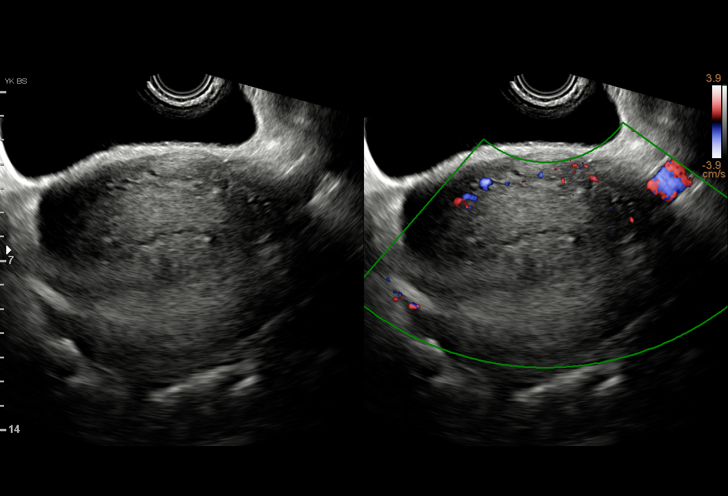
[im 11/19]
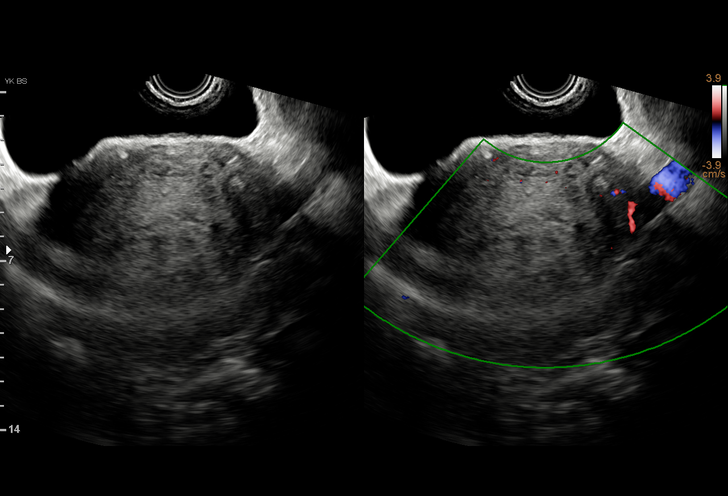
[im 13/19]
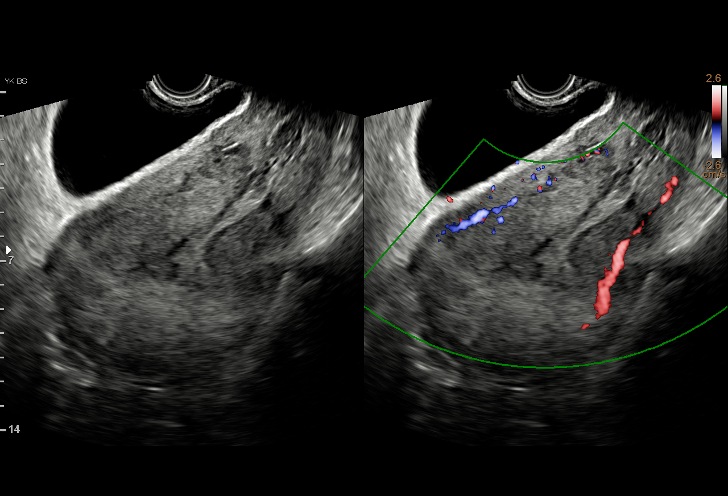
[im 14/19]
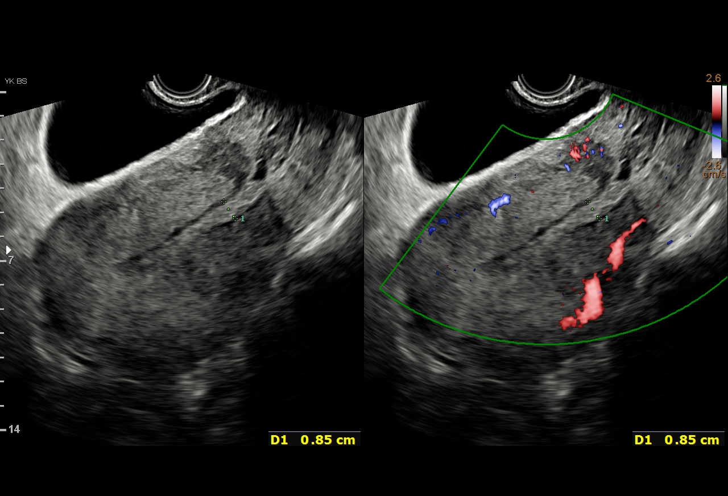
[im 15/19]
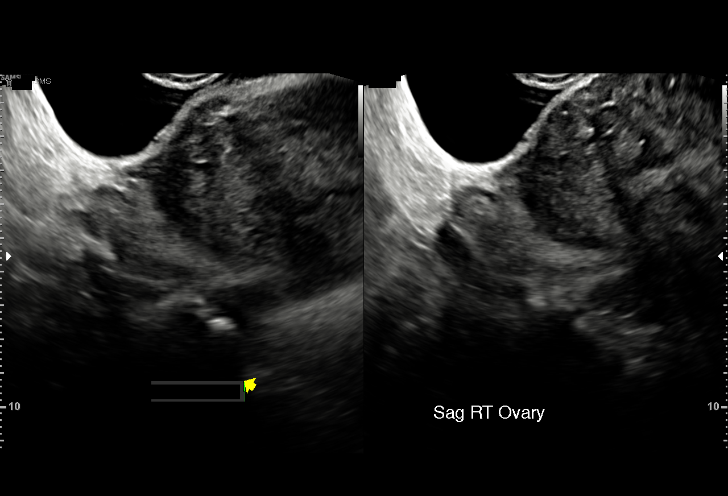
[im 16/19]
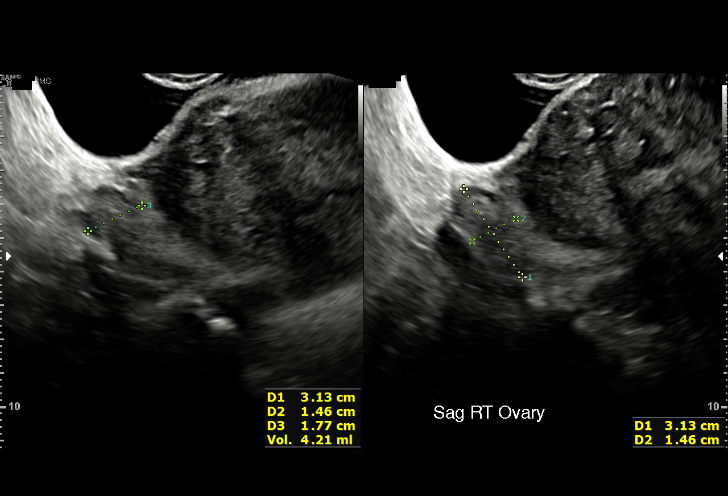
[im 18/19]
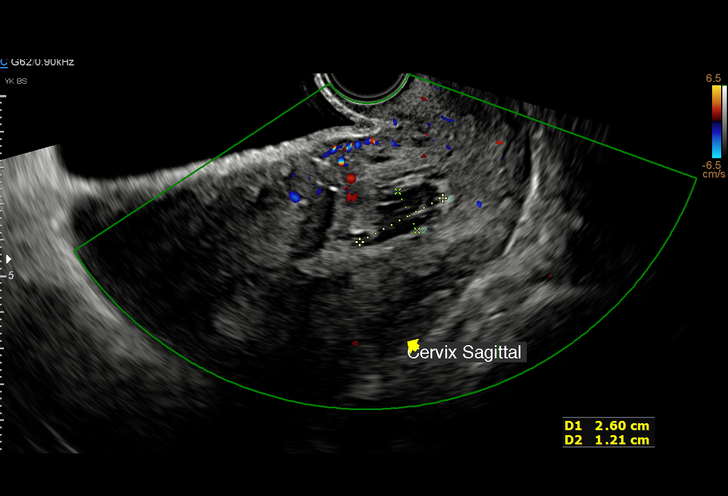
[im 19/19]
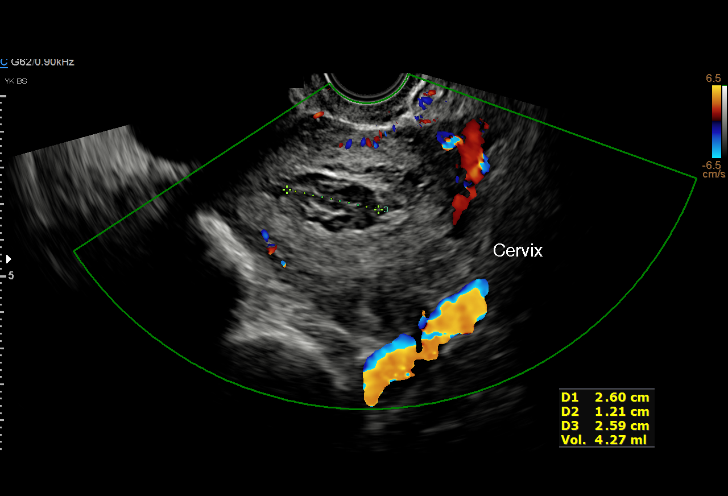

[15 of 19 positions shown; findings below may reference images not displayed]

FINDINGS: Uterus

Measurements: 14.3 x 7.8 x 10.6 cm = volume: 585 mL. No fibroids or
visualized.

Endometrium

Thickness: 8.5 mm. Within the LOWER uterine canal, 2.6 x 2.6 x
cm slightly complicated and anechoic area without vascularity noted,
with appearance more likely representing blood products/hemorrhage.
No focal abnormality or increased echogenicity visualized.

Right ovary

Measurements: 3 x 1.8 x 1 5 cm = volume: 4.7 mL. Normal
appearance/no adnexal mass.

Left ovary

Measurements: 3 x 2 x 1.5 cm = volume: 4.6 mL. Normal appearance/no
adnexal mass.

Other findings:  No abnormal free fluid
IMPRESSION: 1. 2.6 x 2.6 x 1.2 cm slightly complicated area within the LOWER
uterine canal, indeterminate but more likely blood
products/hemorrhage.
2. No other significant abnormalities.

## 2022-11-24 ENCOUNTER — Telehealth: Payer: Self-pay

## 2022-11-24 NOTE — Telephone Encounter (Signed)
Copied from CRM [id number]. Topic: General - Other >> Nov 24, 2022  4:39 PM Epimenio Foot F wrote: Reason for CRM: Pt is calling because she wants to know if she is able to have a breast exam done during her NP appointment. Pt also wanted to know if she could have labs done during the appointment.

## 2022-11-26 ENCOUNTER — Encounter: Payer: Self-pay | Admitting: Family Medicine

## 2022-12-04 ENCOUNTER — Encounter: Payer: Self-pay | Admitting: Family Medicine

## 2022-12-04 ENCOUNTER — Ambulatory Visit (INDEPENDENT_AMBULATORY_CARE_PROVIDER_SITE_OTHER): Payer: PRIVATE HEALTH INSURANCE | Admitting: Family Medicine

## 2022-12-04 VITALS — BP 113/74 | HR 80 | Ht 68.0 in | Wt 282.0 lb

## 2022-12-04 DIAGNOSIS — L300 Nummular dermatitis: Secondary | ICD-10-CM | POA: Diagnosis not present

## 2022-12-04 DIAGNOSIS — G40219 Localization-related (focal) (partial) symptomatic epilepsy and epileptic syndromes with complex partial seizures, intractable, without status epilepticus: Secondary | ICD-10-CM

## 2022-12-04 DIAGNOSIS — F411 Generalized anxiety disorder: Secondary | ICD-10-CM

## 2022-12-04 DIAGNOSIS — Z0000 Encounter for general adult medical examination without abnormal findings: Secondary | ICD-10-CM | POA: Insufficient documentation

## 2022-12-04 DIAGNOSIS — Z0001 Encounter for general adult medical examination with abnormal findings: Secondary | ICD-10-CM | POA: Diagnosis not present

## 2022-12-04 DIAGNOSIS — Z7689 Persons encountering health services in other specified circumstances: Secondary | ICD-10-CM | POA: Insufficient documentation

## 2022-12-04 DIAGNOSIS — Z8632 Personal history of gestational diabetes: Secondary | ICD-10-CM

## 2022-12-04 DIAGNOSIS — Z91018 Allergy to other foods: Secondary | ICD-10-CM | POA: Insufficient documentation

## 2022-12-04 MED ORDER — CLOBETASOL PROPIONATE 0.05 % EX OINT: 1.0000 | TOPICAL_OINTMENT | Freq: Every day | CUTANEOUS | 0 refills | Status: AC

## 2022-12-04 MED ORDER — EPINEPHRINE 0.3 MG/0.3ML IJ SOAJ: 0.3000 mg | INTRAMUSCULAR | 0 refills | Status: AC | PRN

## 2022-12-04 NOTE — Patient Instructions (Addendum)
CL n sport wash Vanicream

## 2022-12-04 NOTE — Progress Notes (Signed)
New patient visit   Patient: Diane Guerrero   DOB: Sep 06, 1995   27 y.o. Female  MRN: [id number] Visit Date: 12/04/2022  Today's healthcare provider: Jacky Kindle, FNP  Patient presents for new patient visit to establish care.  Introduced to Publishing rights manager role and practice setting.  All questions answered.  Discussed provider/patient relationship and expectations.  Chief Complaint  Patient presents with   New Patient (Initial Visit)    Establish Eczema hands since son was born--redness, burning, uncomfortable-2 years   Subjective    Diane Guerrero is a 27 y.o. female who presents today as a new patient to establish care.  HPI HPI     New Patient (Initial Visit)    Additional comments: Establish Eczema hands since son was born--redness, burning, uncomfortable-2 years      Last edited by Shelly Bombard, CMA on 12/04/2022  9:07 AM.      The patient, with a past medical history of seizures, gestational diabetes, and asthma, presents to establish care and address ongoing eczema issues with her hands. She has not sought dermatological care for this issue. She also reports a history of postpartum anxiety, abdominal pain, and ear pain, which have since stabilized. She has been off seizure medication for at least three years and has not had a seizure in that time.  The patient is currently trying to conceive another child. She has been tracking her food intake and water consumption for the past two weeks and has noticed a slight decrease in her weight. She reports that she has not been ovulating and has had irregular periods. She is considering dietary changes to aid in conception and overall health.  The patient also mentions a history of gestational diabetes during her previous pregnancy. She managed this with dietary changes and a low dose of blood sugar medication. She has not had any issues with her gallbladder for about a year after experiencing a flare-up postpartum.  The  patient has a history of asthma but has not had any recent issues. She also reports having stomach issues depending on what she eats, which she has experienced her whole life.  Past Medical History:  Diagnosis Date   Allergy    Anxiety    Asthma    Complex partial seizure (HCC)    Diabetes mellitus without complication (HCC)    Hyperglycemia    Obesity    POTS (postural orthostatic tachycardia syndrome)    Seasonal allergies    Seizures (HCC)    Syncope and collapse    Thrombocytosis    Past Surgical History:  Procedure Laterality Date   CESAREAN SECTION N/A 02/19/2021   Procedure: PRIMARY CESAREAN SECTION EDC: 03-10-21 ALLERG: SUPRAX, PEANUT OIL, FIRE ANT;  Surgeon: Lyn Henri, MD;  Location: [facility];  Service: Obstetrics;  Laterality: N/A;   LINGUAL FRENECTOMY  1996   MYRINGOTOMY Bilateral 1996   TYMPANOSTOMY TUBE PLACEMENT Bilateral    Family Status  Relation Name Status   Mother Verlon Setting Alive   Father  Alive   MGM Lorette Ang - Breast Cancer (Not Specified)   Oneal Grout  (Not Specified)   MGF Marye Round (Not Specified)   PGM Lorette Ang (Not Specified)   Sister Destinee Caviness (Not Specified)   Sister Wendall Mola (Not Specified)  No partnership data on file   Family History  Problem Relation Age of Onset   Allergies Mother    Asthma Mother    Heart disease Mother  Rheum arthritis Mother    Lupus Mother    Stroke Mother    ADD / ADHD Mother    Alcohol abuse Mother    Anxiety disorder Mother    Drug abuse Mother    Obesity Mother    Emphysema Maternal Grandmother    Allergies Maternal Grandmother    Cancer Maternal Grandmother        bladder   Obesity Maternal Grandmother    COPD Paternal Uncle    Heart disease Maternal Grandfather    Cancer Maternal Grandfather        lung   Lung disease Maternal Grandfather    Cancer Paternal Grandmother        breast   Diabetes Paternal Grandmother    Varicose Veins Paternal  Grandmother    Birth defects Sister    Obesity Sister    Miscarriages / [place] Sister    Social History   Socioeconomic History   Marital status: Married    Spouse name: Not on file   Number of children: Not on file   Years of education: Not on file   Highest education level: Associate degree: occupational, Scientist, product/process development, or vocational program  Occupational History   Not on file  Tobacco Use   Smoking status: Never    Passive exposure: Never   Smokeless tobacco: Never  Vaping Use   Vaping status: Never Used  Substance and Sexual Activity   Alcohol use: Never   Drug use: Never   Sexual activity: Yes    Birth control/protection: Coitus interruptus    Comment: kyleena  Other Topics Concern   Not on file  Social History Narrative   ** Merged History Encounter **       Social Determinants of Health   Financial Resource Strain: Low Risk  (12/02/2022)   Overall Financial Resource Strain (CARDIA)    Difficulty of Paying Living Expenses: Not hard at all  Food Insecurity: No Food Insecurity (12/02/2022)   Hunger Vital Sign    Worried About Running Out of Food in the Last Year: Never true    Ran Out of Food in the Last Year: Never true  Transportation Needs: No Transportation Needs (12/02/2022)   PRAPARE - Administrator, Civil Service (Medical): No    Lack of Transportation (Non-Medical): No  Physical Activity: Insufficiently Active (12/02/2022)   Exercise Vital Sign    Days of Exercise per Week: 3 days    Minutes of Exercise per Session: 30 min  Stress: No Stress Concern Present (12/02/2022)   [employer] of Occupational Health - Occupational Stress Questionnaire    Feeling of Stress : Only a little  Social Connections: Moderately Integrated (12/02/2022)   Social Connection and Isolation Panel [NHANES]    Frequency of Communication with Friends and Family: More than three times a week    Frequency of Social Gatherings with Friends and Family: Patient  declined    Attends Religious Services: More than 4 times per year    Active Member of [employer] or Organizations: No    Attends Engineer, structural: Not on file    Marital Status: Married   Outpatient Medications Prior to Visit  Medication Sig   [DISCONTINUED] EPINEPHrine (EPI-PEN) 0.3 mg/0.3 mL SOAJ injection Inject 0.3 mg into the muscle as needed.   [DISCONTINUED] FOLIC ACID PO Take 5 mg by mouth.   [DISCONTINUED] Prenatal Vit-DSS-Fe Cbn-FA (PRENATAL AD PO) Take by mouth.   No facility-administered medications prior to visit.   Allergies  Allergen Reactions   Fire Ant Hives   Peanut Oil    Suprax [Cefixime]     Hives, itching    Immunization History  Administered Date(s) Administered   Hepatitis B 03/25/2013   Hepatitis B, ADULT 02/14/2015   Influenza,inj,Quad PF,6+ Mos 02/14/2015   Influenza-Unspecified 03/22/2014   Pneumococcal Polysaccharide-23 06/16/2013   Tdap 06/16/2013, 01/01/2021    Health Maintenance  Topic Date Due   Cervical Cancer Screening (Pap smear)  06/13/2022   COVID-19 Vaccine (1 - 2023-24 season) Never done   INFLUENZA VACCINE  04/20/2023 (Originally 08/21/2022)   DTaP/Tdap/Td (3 - Td or Tdap) 01/02/2031   Hepatitis C Screening  Completed   HIV Screening  Completed   HPV VACCINES  Aged Out   Patient Care Team: Jacky Kindle, FNP as PCP - General (Family Medicine) Kerman Passey, MD (Family Medicine)   Objective    BP 113/74   Pulse 80   Ht 5\' 8"  (1.727 m)   Wt 282 lb (127.9 kg)   LMP 11/30/2022   BMI 42.88 kg/m   Physical Exam Vitals and nursing note reviewed.  Constitutional:      General: She is awake. She is not in acute distress.    Appearance: Normal appearance. She is well-developed and well-groomed. She is obese. She is not ill-appearing, toxic-appearing or diaphoretic.  HENT:     Head: Normocephalic and atraumatic.     Jaw: There is normal jaw occlusion. No trismus, tenderness, swelling or pain on movement.     Right  Ear: Hearing, tympanic membrane, ear canal and external ear normal. There is no impacted cerumen.     Left Ear: Hearing, tympanic membrane, ear canal and external ear normal. There is no impacted cerumen.     Nose: Nose normal. No congestion or rhinorrhea.     Right Turbinates: Not enlarged, swollen or pale.     Left Turbinates: Not enlarged, swollen or pale.     Right Sinus: No maxillary sinus tenderness or frontal sinus tenderness.     Left Sinus: No maxillary sinus tenderness or frontal sinus tenderness.     Mouth/Throat:     Lips: Pink.     Mouth: Mucous membranes are moist. No injury.     Tongue: No lesions.     Pharynx: Oropharynx is clear. Uvula midline. No pharyngeal swelling, oropharyngeal exudate, posterior oropharyngeal erythema or uvula swelling.     Tonsils: No tonsillar exudate or tonsillar abscesses.  Eyes:     General: Lids are normal. Lids are everted, no foreign bodies appreciated. Vision grossly intact. Gaze aligned appropriately. No allergic shiner or visual field deficit.       Right eye: No discharge.        Left eye: No discharge.     Extraocular Movements: Extraocular movements intact.     Conjunctiva/sclera: Conjunctivae normal.     Right eye: Right conjunctiva is not injected. No exudate.    Left eye: Left conjunctiva is not injected. No exudate.    Pupils: Pupils are equal, round, and reactive to light.  Neck:     Thyroid: No thyroid mass, thyromegaly or thyroid tenderness.     Vascular: No carotid bruit.     Trachea: Trachea normal.  Cardiovascular:     Rate and Rhythm: Normal rate and regular rhythm.     Pulses: Normal pulses.          Carotid pulses are 2+ on the right side and 2+ on the left side.  Radial pulses are 2+ on the right side and 2+ on the left side.       Dorsalis pedis pulses are 2+ on the right side and 2+ on the left side.       Posterior tibial pulses are 2+ on the right side and 2+ on the left side.     Heart sounds: Normal heart  sounds, S1 normal and S2 normal. No murmur heard.    No friction rub. No gallop.  Pulmonary:     Effort: Pulmonary effort is normal. No respiratory distress.     Breath sounds: Normal breath sounds and air entry. No stridor. No wheezing, rhonchi or rales.  Chest:     Chest wall: No tenderness.  Abdominal:     General: Abdomen is flat. Bowel sounds are normal. There is no distension.     Palpations: Abdomen is soft. There is no mass.     Tenderness: There is no abdominal tenderness. There is no right CVA tenderness, left CVA tenderness, guarding or rebound.     Hernia: No hernia is present.  Genitourinary:    Comments: Exam deferred; denies complaints Musculoskeletal:        General: No swelling, tenderness, deformity or signs of injury. Normal range of motion.     Cervical back: Full passive range of motion without pain, normal range of motion and neck supple. No edema, rigidity or tenderness. No muscular tenderness.     Right lower leg: No edema.     Left lower leg: No edema.  Lymphadenopathy:     Cervical: No cervical adenopathy.     Right cervical: No superficial, deep or posterior cervical adenopathy.    Left cervical: No superficial, deep or posterior cervical adenopathy.  Skin:    General: Skin is warm and dry.     Capillary Refill: Capillary refill takes less than 2 seconds.     Coloration: Skin is not jaundiced or pale.     Findings: Erythema, lesion and rash present. No bruising.     Comments: Eczematoid rash- present on bilateral hands; primary dorsal aspect, some palmar surface   Neurological:     General: No focal deficit present.     Mental Status: She is alert and oriented to person, place, and time. Mental status is at baseline.     GCS: GCS eye subscore is 4. GCS verbal subscore is 5. GCS motor subscore is 6.     Sensory: Sensation is intact. No sensory deficit.     Motor: Motor function is intact. No weakness.     Coordination: Coordination is intact. Coordination  normal.     Gait: Gait is intact. Gait normal.  Psychiatric:        Attention and Perception: Attention and perception normal.        Mood and Affect: Mood and affect normal.        Speech: Speech normal.        Behavior: Behavior normal. Behavior is cooperative.        Thought Content: Thought content normal.        Cognition and Memory: Cognition and memory normal.        Judgment: Judgment normal.    Depression Screen    12/04/2022    9:15 AM  PHQ 2/9 Scores  PHQ - 2 Score 0  PHQ- 9 Score 9   No results found for any visits on 12/04/22.  Assessment & Plan      Problem List Items Addressed This  Visit       Nervous and Auditory   Partial symptomatic epilepsy with complex partial seizures, intractable, without status epilepticus (HCC)     Musculoskeletal and Integument   Discoid eczema   Relevant Medications   clobetasol ointment (TEMOVATE) 0.05 %     Other   Annual physical exam - Primary   Relevant Orders   CBC with Differential/Platelet   Comprehensive Metabolic Panel (CMET)   TSH   Lipid panel   Vitamin D (25 hydroxy)   Establishing care with new doctor, encounter for   Food allergy   Relevant Medications   EPINEPHrine (EPIPEN 2-PAK) 0.3 mg/0.3 mL IJ SOAJ injection   GAD (generalized anxiety disorder)   History of gestational diabetes   Relevant Orders   Hemoglobin A1c   Morbid obesity (HCC)   Relevant Orders   Vitamin D (25 hydroxy)   Eczema Chronic hand eczema with recent exacerbation. Discussed the use of dilute bleach washes and Vanicream for management. -Start Clobetasol at night. -Consider use of gloves when in water. -Consider sport wash and Vanicream.  Anxiety History of postpartum anxiety managed without medication. Previously on Buspirone and Zoloft. -Continue current management strategies. -Consider restarting Zoloft if needed, especially if planning for pregnancy.  Seizure Disorder History of complex partial seizures, off medications  for at least three years with no recent seizures. -No immediate action, but consider follow-up with neurology.  Weight Management Patient has been logging food and has lost weight. Discussed BMR and calorie reduction for weight loss. -Continue current dietary changes. -Consider increasing physical activity. -Check labs to rule out metabolic causes of weight gain.  Breast Health Dense breast tissue with family history of breast cancer. Discussed self-breast exam techniques and signs of concern. -Continue self-breast exams. -Consider follow-up for clinical breast exam in 6 months.  Asthma History of childhood asthma, currently asymptomatic. -No immediate action, monitor for any respiratory symptoms.  Gallbladder Disease History of gallbladder flare postpartum, managed with dietary changes. -Continue current dietary management. -Monitor for any abdominal pain.  General Health Maintenance -Order labs including CBC, CMP, TSH, HbA1c, lipid panel, and Vitamin D. -Update EpiPen prescription. -Consider follow-up in 6 months or sooner if labs are abnormal or if patient becomes pregnant.  Return in about 1 year (around 12/04/2023) for annual examination.    Leilani Merl, FNP, have reviewed all documentation for this visit. The documentation on 12/04/22 for the exam, diagnosis, procedures, and orders are all accurate and complete.  Jacky Kindle, FNP  [facility] [phone] (phone) [phone] (fax)  [facility]

## 2022-12-05 LAB — CBC WITH DIFFERENTIAL/PLATELET
Basophils Absolute: 0.1 10*3/uL (ref 0.0–0.2)
Basos: 1 %
EOS (ABSOLUTE): 0.2 10*3/uL (ref 0.0–0.4)
Eos: 2 %
Hematocrit: 41.6 % (ref 34.0–46.6)
Hemoglobin: 14.2 g/dL (ref 11.1–15.9)
Immature Grans (Abs): 0 10*3/uL (ref 0.0–0.1)
Immature Granulocytes: 0 %
Lymphocytes Absolute: 2.4 10*3/uL (ref 0.7–3.1)
Lymphs: 29 %
MCH: 30.9 pg (ref 26.6–33.0)
MCHC: 34.1 g/dL (ref 31.5–35.7)
MCV: 90 fL (ref 79–97)
Monocytes Absolute: 0.6 10*3/uL (ref 0.1–0.9)
Monocytes: 7 %
Neutrophils Absolute: 4.8 10*3/uL (ref 1.4–7.0)
Neutrophils: 61 %
Platelets: 410 10*3/uL (ref 150–450)
RBC: 4.6 x10E6/uL (ref 3.77–5.28)
RDW: 12.3 % (ref 11.7–15.4)
WBC: 8 10*3/uL (ref 3.4–10.8)

## 2022-12-05 LAB — LIPID PANEL
Chol/HDL Ratio: 4.1 ratio (ref 0.0–4.4)
Cholesterol, Total: 163 mg/dL (ref 100–199)
HDL: 40 mg/dL (ref >39)
LDL Chol Calc (NIH): 88 mg/dL (ref 0–99)
Triglycerides: 204 mg/dL — ABNORMAL HIGH (ref 0–149)
VLDL Cholesterol Cal: 35 mg/dL (ref 5–40)

## 2022-12-05 LAB — COMPREHENSIVE METABOLIC PANEL
ALT: 43 [IU]/L — ABNORMAL HIGH (ref 0–32)
AST: 20 [IU]/L (ref 0–40)
Albumin: 4.3 g/dL (ref 4.0–5.0)
Alkaline Phosphatase: 109 [IU]/L (ref 44–121)
BUN/Creatinine Ratio: 14 (ref 9–23)
BUN: 10 mg/dL (ref 6–20)
Bilirubin Total: 1.3 mg/dL — ABNORMAL HIGH (ref 0.0–1.2)
CO2: 22 mmol/L (ref 20–29)
Calcium: 9.6 mg/dL (ref 8.7–10.2)
Chloride: 104 mmol/L (ref 96–106)
Creatinine, Ser: 0.7 mg/dL (ref 0.57–1.00)
Globulin, Total: 2.5 g/dL (ref 1.5–4.5)
Glucose: 94 mg/dL (ref 70–99)
Potassium: 4.1 mmol/L (ref 3.5–5.2)
Sodium: 141 mmol/L (ref 134–144)
Total Protein: 6.8 g/dL (ref 6.0–8.5)
eGFR: 121 mL/min/{1.73_m2} (ref >59)

## 2022-12-05 LAB — HEMOGLOBIN A1C
Est. average glucose Bld gHb Est-mCnc: 108 mg/dL
Hgb A1c MFr Bld: 5.4 % (ref 4.8–5.6)

## 2022-12-05 LAB — VITAMIN D 25 HYDROXY (VIT D DEFICIENCY, FRACTURES): Vit D, 25-Hydroxy: 26.6 ng/mL — ABNORMAL LOW (ref 30.0–100.0)

## 2022-12-05 LAB — TSH: TSH: 1.68 u[IU]/mL (ref 0.450–4.500)

## 2023-05-19 ENCOUNTER — Encounter: Payer: Self-pay | Admitting: Family Medicine

## 2023-05-19 ENCOUNTER — Ambulatory Visit (INDEPENDENT_AMBULATORY_CARE_PROVIDER_SITE_OTHER): Payer: PRIVATE HEALTH INSURANCE | Admitting: Family Medicine

## 2023-05-19 VITALS — BP 115/82 | HR 77 | Temp 97.7°F | Resp 20 | Ht 68.0 in | Wt 280.0 lb

## 2023-05-19 DIAGNOSIS — R739 Hyperglycemia, unspecified: Secondary | ICD-10-CM

## 2023-05-19 DIAGNOSIS — Z91018 Allergy to other foods: Secondary | ICD-10-CM

## 2023-05-19 DIAGNOSIS — F411 Generalized anxiety disorder: Secondary | ICD-10-CM

## 2023-05-19 DIAGNOSIS — D75839 Thrombocytosis, unspecified: Secondary | ICD-10-CM

## 2023-05-19 DIAGNOSIS — E559 Vitamin D deficiency, unspecified: Secondary | ICD-10-CM

## 2023-05-19 DIAGNOSIS — Z8249 Family history of ischemic heart disease and other diseases of the circulatory system: Secondary | ICD-10-CM

## 2023-05-19 DIAGNOSIS — R5383 Other fatigue: Secondary | ICD-10-CM

## 2023-05-19 LAB — POCT GLYCOSYLATED HEMOGLOBIN (HGB A1C)
HbA1c POC (<> result, manual entry): 5.2 % (ref 4.0–5.6)
HbA1c, POC (controlled diabetic range): 5.2 % (ref 0.0–7.0)
HbA1c, POC (prediabetic range): 5.2 % — AB (ref 5.7–6.4)
Hemoglobin A1C: 5.2 % (ref 4.0–5.6)

## 2023-05-19 NOTE — Assessment & Plan Note (Signed)
 Not on any treatment for this.  Seems to be doing well.  Avoids caffeine.

## 2023-05-19 NOTE — Assessment & Plan Note (Signed)
 He is allergic to peanuts and has an EpiPen .

## 2023-05-19 NOTE — Assessment & Plan Note (Signed)
 Fasting blood sugar was 107.  A1c 5.2%.  Had gestational diabetes.  Discussed that she is more likely to develop diabetes as she ages.  Discussed a healthy diet with reduced sugars and carbohydrates.  Encouraged daily exercise

## 2023-05-19 NOTE — Assessment & Plan Note (Signed)
 Has history of mild thrombocytosis.  Will check CBC today

## 2023-05-19 NOTE — Progress Notes (Signed)
 New Patient Office Visit  Subjective    Patient ID: Diane Guerrero, female    DOB: September 13, 1995  Age: 28 y.o. MRN: [id number]  CC:  Chief Complaint  Patient presents with   Establish Care    HPI Diane Guerrero presents to establish care.  Delightful 28 year old woman here to establish care.  She is a stay-at-home mother.   She had complex partial seizures as a child.  She stopped her seizure medication so she could get pregnant.  She has been off her seizure medicine since 2021 and denies any seizure activity.   She has POTS but has not had any issues recently. She had gestational diabetes and was treated during her pregnancy.  She had an A1c 11/24 and A1c was 5.4%.  She drinks water  with citrus added.  No sugary beverages. She had asthma as a child but has not had an inhaler in 10 years.  Denies any episodes of wheezing or cough nocturnally or associated with exercise. She has had issues with anxiety especially after the birth of her child.  If she drinks caffeine her anxiety gets worse.  Currently does not take any medication for anxiety. Her chart shows she has had thrombocytosis.  11/16/2021 platelet count was 421. She has an EpiPen  and explains that she is allergic to peanuts.  Discussed avoiding all nuts. S/p  myringotomy and tubes, s/p lingual frenectomy.   Outpatient Encounter Medications as of 05/19/2023  Medication Sig   Cholecalciferol (VITAMIN D3) 50 MCG (2000 UT) capsule Take 2,000 Units by mouth daily.   EPINEPHrine  (EPIPEN  2-PAK) 0.3 mg/0.3 mL IJ SOAJ injection Inject 0.3 mg into the muscle as needed for anaphylaxis.   Folic Acid 5 MG CAPS Take 1 capsule by mouth daily.   Prenat-Fe Carbonyl-FA-Omega 3 (ONE-A-DAY WOMENS PRENATAL 1) 28-0.8-235 MG CAPS Take 1 capsule by mouth daily.   clobetasol  ointment (TEMOVATE ) 0.05 % Apply 1 Application topically at bedtime.   No facility-administered encounter medications on file as of 05/19/2023.    Past Medical History:   Diagnosis Date   Allergy    Anxiety    Asthma    Complex partial seizure (HCC)    Diabetes mellitus without complication (HCC)    Hyperglycemia    Obesity    POTS (postural orthostatic tachycardia syndrome)    Seasonal allergies    Seizures (HCC)    Syncope and collapse    Thrombocytosis     Past Surgical History:  Procedure Laterality Date   CESAREAN SECTION N/A 02/19/2021   Procedure: PRIMARY CESAREAN SECTION EDC: 03-10-21 ALLERG: SUPRAX, PEANUT OIL, FIRE ANT;  Surgeon: Gretchen Leavell, MD;  Location: [facility];  Service: Obstetrics;  Laterality: N/A;   LINGUAL FRENECTOMY  1996   MYRINGOTOMY Bilateral 1996   TYMPANOSTOMY TUBE PLACEMENT Bilateral     Family History  Problem Relation Age of Onset   Allergies Mother    Asthma Mother    Heart disease Mother    Rheum arthritis Mother    Lupus Mother    Stroke Mother    ADD / ADHD Mother    Alcohol abuse Mother    Anxiety disorder Mother    Drug abuse Mother    Obesity Mother    Emphysema Maternal Grandmother    Allergies Maternal Grandmother    Cancer Maternal Grandmother        bladder   Obesity Maternal Grandmother    COPD Paternal Uncle    Heart disease Maternal Grandfather    Cancer  Maternal Grandfather        lung   Lung disease Maternal Grandfather    Cancer Paternal Grandmother        breast   Diabetes Paternal Grandmother    Varicose Veins Paternal Grandmother    Birth defects Sister    Obesity Sister    Miscarriages / [place] Sister     Social History   Socioeconomic History   Marital status: Married    Spouse name: Not on file   Number of children: Not on file   Years of education: Not on file   Highest education level: Associate degree: occupational, Scientist, product/process development, or vocational program  Occupational History   Not on file  Tobacco Use   Smoking status: Never    Passive exposure: Never   Smokeless tobacco: Never  Vaping Use   Vaping status: Never Used  Substance and Sexual Activity    Alcohol use: Never   Drug use: Never   Sexual activity: Yes    Birth control/protection: Coitus interruptus    Comment: [email or web address]   Other Topics Concern   Not on file  Social History Narrative   ** Merged History Encounter **       Social Drivers of Health   Financial Resource Strain: Low Risk  (12/02/2022)   Overall Financial Resource Strain (CARDIA)    Difficulty of Paying Living Expenses: Not hard at all  Food Insecurity: No Food Insecurity (12/02/2022)   Hunger Vital Sign    Worried About Running Out of Food in the Last Year: Never true    Ran Out of Food in the Last Year: Never true  Transportation Needs: No Transportation Needs (12/02/2022)   PRAPARE - Administrator, Civil Service (Medical): No    Lack of Transportation (Non-Medical): No  Physical Activity: Insufficiently Active (12/02/2022)   Exercise Vital Sign    Days of Exercise per Week: 3 days    Minutes of Exercise per Session: 30 min  Stress: No Stress Concern Present (12/02/2022)   [employer] of Occupational Health - Occupational Stress Questionnaire    Feeling of Stress : Only a little  Social Connections: Moderately Integrated (12/02/2022)   Social Connection and Isolation Panel [NHANES]    Frequency of Communication with Friends and Family: More than three times a week    Frequency of Social Gatherings with Friends and Family: Patient declined    Attends Religious Services: More than 4 times per year    Active Member of [employer] or Organizations: No    Attends Engineer, structural: Not on file    Marital Status: Married  Catering manager Violence: Not on file    ROS      Objective   BP 115/82 (BP Location: Left Arm, Patient Position: Sitting, Cuff Size: Normal)   Pulse 77   Temp 97.7 F (36.5 C) (Oral)   Resp 20   Ht 5\' 8"  (1.727 m)   Wt 280 lb (127 kg)   SpO2 97%   BMI 42.57 kg/m    Physical Exam Vitals and nursing note reviewed.  Constitutional:      Appearance:  Normal appearance.  HENT:     Head: Normocephalic and atraumatic.  Eyes:     Conjunctiva/sclera: Conjunctivae normal.  Cardiovascular:     Rate and Rhythm: Normal rate and regular rhythm.  Pulmonary:     Effort: Pulmonary effort is normal.     Breath sounds: Normal breath sounds.  Musculoskeletal:     Right lower leg:  No edema.     Left lower leg: No edema.  Skin:    General: Skin is warm and dry.  Neurological:     Mental Status: She is alert and oriented to person, place, and time.  Psychiatric:        Mood and Affect: Mood normal.        Behavior: Behavior normal.        Thought Content: Thought content normal.        Judgment: Judgment normal.            The ASCVD Risk score (Arnett DK, et al., 2019) failed to calculate for the following reasons:   The 2019 ASCVD risk score is only valid for ages 86 to 33     Assessment & Plan:  Elevated blood sugar Assessment & Plan: Fasting blood sugar was 107.  A1c 5.2%.  Had gestational diabetes.  Discussed that she is more likely to develop diabetes as she ages.  Discussed a healthy diet with reduced sugars and carbohydrates.  Encouraged daily exercise  Orders: -     POCT glycosylated hemoglobin (Hb A1C) -     CMP14+EGFR  Thrombocytosis Assessment & Plan: Has history of mild thrombocytosis.  Will check CBC today  Orders: -     CBC with Differential/Platelet  Vitamin D  deficiency -     VITAMIN D  25 Hydroxy (Vit-D Deficiency, Fractures)  Family history of early CAD -     Lipid panel  Other fatigue -     TSH + free T4  Food allergy Assessment & Plan: He is allergic to peanuts and has an EpiPen .   GAD (generalized anxiety disorder) Assessment & Plan: Not on any treatment for this.  Seems to be doing well.  Avoids caffeine.     Return if symptoms worsen or fail to improve.   Maddyx Wieck K Cheikh Bramble, MD

## 2023-05-20 ENCOUNTER — Encounter: Payer: Self-pay | Admitting: Family Medicine

## 2023-05-20 ENCOUNTER — Other Ambulatory Visit: Payer: Self-pay | Admitting: Family Medicine

## 2023-05-20 DIAGNOSIS — R748 Abnormal levels of other serum enzymes: Secondary | ICD-10-CM

## 2023-05-20 DIAGNOSIS — E559 Vitamin D deficiency, unspecified: Secondary | ICD-10-CM

## 2023-05-20 LAB — CMP14+EGFR
ALT: 52 IU/L — ABNORMAL HIGH (ref 0–32)
AST: 27 IU/L (ref 0–40)
Albumin: 4.4 g/dL (ref 4.0–5.0)
Alkaline Phosphatase: 103 IU/L (ref 44–121)
BUN/Creatinine Ratio: 13 (ref 9–23)
BUN: 9 mg/dL (ref 6–20)
Bilirubin Total: 1.1 mg/dL (ref 0.0–1.2)
CO2: 20 mmol/L (ref 20–29)
Calcium: 9.6 mg/dL (ref 8.7–10.2)
Chloride: 105 mmol/L (ref 96–106)
Creatinine, Ser: 0.69 mg/dL (ref 0.57–1.00)
Globulin, Total: 2.3 g/dL (ref 1.5–4.5)
Glucose: 100 mg/dL — ABNORMAL HIGH (ref 70–99)
Potassium: 4.2 mmol/L (ref 3.5–5.2)
Sodium: 140 mmol/L (ref 134–144)
Total Protein: 6.7 g/dL (ref 6.0–8.5)
eGFR: 120 mL/min/{1.73_m2} (ref >59)

## 2023-05-20 LAB — CBC WITH DIFFERENTIAL/PLATELET
Basophils Absolute: 0 10*3/uL (ref 0.0–0.2)
Basos: 1 %
EOS (ABSOLUTE): 0.3 10*3/uL (ref 0.0–0.4)
Eos: 4 %
Hematocrit: 44 % (ref 34.0–46.6)
Hemoglobin: 14.5 g/dL (ref 11.1–15.9)
Immature Grans (Abs): 0 10*3/uL (ref 0.0–0.1)
Immature Granulocytes: 0 %
Lymphocytes Absolute: 2.8 10*3/uL (ref 0.7–3.1)
Lymphs: 37 %
MCH: 30.7 pg (ref 26.6–33.0)
MCHC: 33 g/dL (ref 31.5–35.7)
MCV: 93 fL (ref 79–97)
Monocytes Absolute: 0.6 10*3/uL (ref 0.1–0.9)
Monocytes: 7 %
Neutrophils Absolute: 3.9 10*3/uL (ref 1.4–7.0)
Neutrophils: 51 %
Platelets: 394 10*3/uL (ref 150–450)
RBC: 4.72 x10E6/uL (ref 3.77–5.28)
RDW: 12.5 % (ref 11.7–15.4)
WBC: 7.6 10*3/uL (ref 3.4–10.8)

## 2023-05-20 LAB — LIPID PANEL
Chol/HDL Ratio: 3.9 ratio (ref 0.0–4.4)
Cholesterol, Total: 160 mg/dL (ref 100–199)
HDL: 41 mg/dL (ref >39)
LDL Chol Calc (NIH): 91 mg/dL (ref 0–99)
Triglycerides: 161 mg/dL — ABNORMAL HIGH (ref 0–149)
VLDL Cholesterol Cal: 28 mg/dL (ref 5–40)

## 2023-05-20 LAB — VITAMIN D 25 HYDROXY (VIT D DEFICIENCY, FRACTURES): Vit D, 25-Hydroxy: 26.8 ng/mL — ABNORMAL LOW (ref 30.0–100.0)

## 2023-05-20 MED ORDER — VITAMIN D (ERGOCALCIFEROL) 1.25 MG (50000 UNIT) PO CAPS: 50000.0000 [IU] | ORAL_CAPSULE | ORAL | 1 refills | Status: AC

## 2023-05-21 ENCOUNTER — Encounter: Payer: Self-pay | Admitting: Family Medicine

## 2023-05-21 ENCOUNTER — Ambulatory Visit: Payer: Self-pay

## 2023-05-21 LAB — TSH+FREE T4
Free T4: 1.04 ng/dL (ref 0.82–1.77)
TSH: 3.16 u[IU]/mL (ref 0.450–4.500)

## 2023-05-21 LAB — SPECIMEN STATUS REPORT

## 2023-05-23 LAB — COMPREHENSIVE METABOLIC PANEL WITH GFR
ALT: 50 IU/L — ABNORMAL HIGH (ref 0–32)
AST: 24 IU/L (ref 0–40)
Albumin: 4.3 g/dL (ref 4.0–5.0)
Alkaline Phosphatase: 92 IU/L (ref 44–121)
BUN/Creatinine Ratio: 9 (ref 9–23)
BUN: 7 mg/dL (ref 6–20)
Bilirubin Total: 1.5 mg/dL — ABNORMAL HIGH (ref 0.0–1.2)
CO2: 22 mmol/L (ref 20–29)
Calcium: 9.3 mg/dL (ref 8.7–10.2)
Chloride: 103 mmol/L (ref 96–106)
Creatinine, Ser: 0.8 mg/dL (ref 0.57–1.00)
Globulin, Total: 2.2 g/dL (ref 1.5–4.5)
Glucose: 98 mg/dL (ref 70–99)
Potassium: 4.1 mmol/L (ref 3.5–5.2)
Sodium: 138 mmol/L (ref 134–144)
Total Protein: 6.5 g/dL (ref 6.0–8.5)
eGFR: 102 mL/min/{1.73_m2} (ref >59)

## 2023-05-23 LAB — HEPATITIS C ANTIBODY: Hep C Virus Ab: NONREACTIVE

## 2023-05-23 LAB — HEPATITIS B SURFACE ANTIBODY,QUALITATIVE

## 2023-05-23 LAB — MITOCHONDRIAL ANTIBODIES: Mitochondrial Ab: <20 U (ref 0.0–20.0)

## 2023-05-23 LAB — COPPER, SERUM: Copper: 104 ug/dL (ref 80–158)

## 2023-05-23 LAB — ANA W/REFLEX IF POSITIVE: Anti Nuclear Antibody (ANA): NEGATIVE

## 2023-05-23 LAB — FERRITIN: Ferritin: 85 ng/mL (ref 15–150)

## 2023-05-23 LAB — HEPATITIS B SURFACE ANTIGEN: Hepatitis B Surface Ag: NEGATIVE

## 2023-05-23 LAB — ANTI-SMOOTH MUSCLE ANTIBODY, IGG: Smooth Muscle Ab: 6 U (ref 0–19)

## 2023-05-25 ENCOUNTER — Encounter: Payer: Self-pay | Admitting: Family Medicine

## 2023-06-01 ENCOUNTER — Ambulatory Visit
Admission: RE | Admit: 2023-06-01 | Discharge: 2023-06-01 | Disposition: A | Discharge status: Home / Self Care | Payer: PRIVATE HEALTH INSURANCE | Source: Ambulatory Visit | Attending: Family Medicine | Admitting: Family Medicine

## 2023-06-01 ENCOUNTER — Encounter: Payer: Self-pay | Admitting: Family Medicine

## 2023-06-01 DIAGNOSIS — R748 Abnormal levels of other serum enzymes: Secondary | ICD-10-CM | POA: Insufficient documentation

## 2023-12-06 ENCOUNTER — Inpatient Hospital Stay (HOSPITAL_COMMUNITY)
Admission: AD | Admit: 2023-12-06 | Discharge: 2023-12-06 | Disposition: A | Discharge status: Home / Self Care | Payer: PRIVATE HEALTH INSURANCE | Attending: Obstetrics and Gynecology | Admitting: Obstetrics and Gynecology

## 2023-12-06 ENCOUNTER — Encounter (HOSPITAL_COMMUNITY): Payer: Self-pay | Admitting: Obstetrics and Gynecology

## 2023-12-06 DIAGNOSIS — O219 Vomiting of pregnancy, unspecified: Secondary | ICD-10-CM | POA: Diagnosis present

## 2023-12-06 DIAGNOSIS — R519 Headache, unspecified: Secondary | ICD-10-CM | POA: Insufficient documentation

## 2023-12-06 DIAGNOSIS — O26891 Other specified pregnancy related conditions, first trimester: Secondary | ICD-10-CM | POA: Diagnosis not present

## 2023-12-06 DIAGNOSIS — Z3A13 13 weeks gestation of pregnancy: Secondary | ICD-10-CM | POA: Diagnosis not present

## 2023-12-06 LAB — URINALYSIS, ROUTINE W REFLEX MICROSCOPIC
Bilirubin Urine: NEGATIVE
Glucose, UA: NEGATIVE mg/dL
Hgb urine dipstick: NEGATIVE
Ketones, ur: NEGATIVE mg/dL
Leukocytes,Ua: NEGATIVE
Nitrite: NEGATIVE
Protein, ur: NEGATIVE mg/dL
Specific Gravity, Urine: 1.012 (ref 1.005–1.030)
pH: 6 (ref 5.0–8.0)

## 2023-12-06 MED ORDER — PROCHLORPERAZINE EDISYLATE 10 MG/2ML IJ SOLN
10.0000 mg | Freq: Once | INTRAMUSCULAR | Status: AC
  Administered 2023-12-06: 10 mg via INTRAVENOUS
  Filled 2023-12-06: qty 2

## 2023-12-06 MED ORDER — SODIUM CHLORIDE 0.9 % IV SOLN
8.0000 mg | Freq: Once | INTRAVENOUS | Status: AC
  Administered 2023-12-06: 8 mg via INTRAVENOUS
  Filled 2023-12-06: qty 4

## 2023-12-06 MED ORDER — FAMOTIDINE IN NACL 20-0.9 MG/50ML-% IV SOLN
20.0000 mg | Freq: Once | INTRAVENOUS | Status: AC
  Administered 2023-12-06: 20 mg via INTRAVENOUS
  Filled 2023-12-06: qty 50

## 2023-12-06 MED ORDER — SCOPOLAMINE 1 MG/3DAYS TD PT72
1.0000 | MEDICATED_PATCH | Freq: Once | TRANSDERMAL | Status: DC
Start: 2023-12-06 — End: 2023-12-06
  Administered 2023-12-06: 1 mg via TRANSDERMAL
  Filled 2023-12-06: qty 1

## 2023-12-06 MED ORDER — LACTATED RINGERS IV BOLUS
1000.0000 mL | Freq: Once | INTRAVENOUS | Status: AC
  Administered 2023-12-06: 1000 mL via INTRAVENOUS

## 2023-12-06 NOTE — MAU Provider Note (Signed)
 History     CSN: [id number]  Arrival date and time: 12/06/23 9072   Event Date/Time   First Provider Initiated Contact with Patient 12/06/23 1018      Chief Complaint  Patient presents with   Headache   Nausea   Emesis   HPI [name] is a 28 y.o. year old G56P1001 female at [redacted]w[redacted]d weeks gestation who presents to MAU reporting Ongoing nausea for the past 3 weeks, but was able to keep down fluids.  She states that yesterday she was not able to keep down fluids or anything.  She states that as soon as she would drink water  it would come right back up.  She reports that she was not able to keep down the Zofran  she has at home. She also reports a really bad headache.  She took Tylenol  last night for the headache.  She states it took the edge off.  She thinks the headache is related to not being able to keep down any fluids.  She said all of her symptoms were worse last night. [facility] receives [facility] with [facility].  OB History     Gravida  2   Para  1   Term  1   Preterm  0   AB  0   Living  1      SAB  0   IAB  0   Ectopic  0   Multiple      Live Births  1           Past Medical History:  Diagnosis Date   Allergy    Anxiety    Asthma    Complex partial seizure (HCC)    Diabetes mellitus without complication (HCC)    Hyperglycemia    Obesity    POTS (postural orthostatic tachycardia syndrome)    Seasonal allergies    Seizures (HCC)    Syncope and collapse    Thrombocytosis     Past Surgical History:  Procedure Laterality Date   CESAREAN SECTION N/A 02/19/2021   Procedure: PRIMARY CESAREAN SECTION EDC: 03-10-21 ALLERG: SUPRAX, PEANUT OIL, FIRE ANT;  Surgeon: Lequita Evalene LABOR, MD;  Location: [facility];  Service: Obstetrics;  Laterality: N/A;   LINGUAL FRENECTOMY  1996   MYRINGOTOMY Bilateral 1996   TYMPANOSTOMY TUBE PLACEMENT Bilateral     Family History  Problem Relation Age of Onset   Allergies Mother    Asthma Mother     Heart disease Mother    Rheum arthritis Mother    Lupus Mother    Stroke Mother    ADD / ADHD Mother    Alcohol abuse Mother    Anxiety disorder Mother    Drug abuse Mother    Obesity Mother    Emphysema Maternal Grandmother    Allergies Maternal Grandmother    Cancer Maternal Grandmother        bladder   Obesity Maternal Grandmother    COPD Paternal Uncle    Heart disease Maternal Grandfather    Cancer Maternal Grandfather        lung   Lung disease Maternal Grandfather    Cancer Paternal Grandmother        breast   Diabetes Paternal Grandmother    Varicose Veins Paternal Grandmother    Birth defects Sister    Obesity Sister    Miscarriages / Stillbirths Sister     Social History   Tobacco Use   Smoking status: Never  Passive exposure: Never   Smokeless tobacco: Never  Vaping Use   Vaping status: Never Used  Substance Use Topics   Alcohol use: Never   Drug use: Never    Allergies:  Allergies  Allergen Reactions   Fire Ant Hives   Peanut Oil    Suprax [Cefixime]     Hives, itching    Medications Prior to Admission  Medication Sig Dispense Refill Last Dose/Taking   acetaminophen  (TYLENOL ) 500 MG tablet Take 500 mg by mouth every 6 (six) hours as needed.   12/05/2023 at 11:30 PM   Cholecalciferol (VITAMIN D3) 50 MCG (2000 UT) capsule Take 2,000 Units by mouth daily.   12/05/2023 Evening   Folic Acid 5 MG CAPS Take 1 capsule by mouth daily.   12/05/2023 Evening   ondansetron  (ZOFRAN -ODT) 4 MG disintegrating tablet Take 4 mg by mouth every 8 (eight) hours as needed for nausea or vomiting.   12/05/2023 Evening   Prenat-Fe Carbonyl-FA-Omega 3 (ONE-A-DAY WOMENS PRENATAL 1) 28-0.8-235 MG CAPS Take 1 capsule by mouth daily.   12/05/2023 Evening   clobetasol  ointment (TEMOVATE ) 0.05 % Apply 1 Application topically at bedtime. 30 g 0    EPINEPHrine  (EPIPEN  2-PAK) 0.3 mg/0.3 mL IJ SOAJ injection Inject 0.3 mg into the muscle as needed for anaphylaxis. 1 each 0     Vitamin D , Ergocalciferol , (DRISDOL ) 1.25 MG (50000 UNIT) CAPS capsule Take 1 capsule (50,000 Units total) by mouth every 7 (seven) days. 13 capsule 1     Review of Systems  Constitutional: Negative.   HENT: Negative.    Eyes: Negative.   Respiratory: Negative.    Cardiovascular: Negative.   Gastrointestinal:  Positive for nausea and vomiting.  Endocrine: Negative.   Genitourinary: Negative.   Musculoskeletal: Negative.   Skin: Negative.   Allergic/Immunologic: Negative.   Neurological:  Positive for headaches.  Hematological: Negative.   Psychiatric/Behavioral: Negative.     Physical Exam   Blood pressure 131/73, pulse 86, temperature 98.6 F (37 C), temperature source Oral, resp. rate 18, height 5' 8 (1.727 m), weight 123.9 kg, SpO2 100%.  Physical Exam Vitals and nursing note reviewed.  Constitutional:      Appearance: Normal appearance. She is obese.  Cardiovascular:     Rate and Rhythm: Normal rate.  Pulmonary:     Effort: Pulmonary effort is normal.  Genitourinary:    Comments: Not indicated Musculoskeletal:        General: Normal range of motion.  Neurological:     Mental Status: She is alert and oriented to person, place, and time.  Psychiatric:        Mood and Affect: Mood normal.        Behavior: Behavior normal.        Thought Content: Thought content normal.        Judgment: Judgment normal.    FHTs by BS U/S: 150 bpm (by observation)  Reassessment @ 1140: Patient states she feels so much better and my stomach stopped hurting. ~ 650 mL of LR left to infuse. H/A resolved. MAU Course  Procedures  Patient informed that the ultrasound is considered a limited OB ultrasound and is not intended to be a complete ultrasound exam.  Patient also informed that the ultrasound is not being completed with the intent of assessing for fetal or placental anomalies or any pelvic abnormalities.  Explained that the purpose of today's ultrasound is to assess for viability.   Baby was found to be very active and moving around a  lot. FHR observed at 150 bpm. Patient was very reassured after seeing/hearing that her baby's well-being was good. Patient acknowledges the purpose of the exam and the limitations of the study.     MDM CCUA LR Bolus 1 liter @ 999 mL/hr Pepcid 20 mg IVPB Zofran  8 mg IVPB Scope Patch x 1 Informal BS U/S  Results for orders placed or performed during the hospital encounter of 12/06/23 (from the past 24 hours)  Urinalysis, Routine w reflex microscopic -Urine, Clean Catch     Status: Abnormal   Collection Time: 12/06/23  9:56 AM  Result Value Ref Range   Color, Urine YELLOW YELLOW   APPearance HAZY (A) CLEAR   Specific Gravity, Urine 1.012 1.005 - 1.030   pH 6.0 5.0 - 8.0   Glucose, UA NEGATIVE NEGATIVE mg/dL   Hgb urine dipstick NEGATIVE NEGATIVE   Bilirubin Urine NEGATIVE NEGATIVE   Ketones, ur NEGATIVE NEGATIVE mg/dL   Protein, ur NEGATIVE NEGATIVE mg/dL   Nitrite NEGATIVE NEGATIVE   Leukocytes,Ua NEGATIVE NEGATIVE    Assessment and Plan  1. Nausea and vomiting during pregnancy prior to [redacted] weeks gestation (Primary) - Advised that Scope patch should stay on for 3 days, but continue using Zofran  prn  2. Pregnancy headache in first trimester - Ok to take Tylenol  1000 mg every 8 hours prn   3. [redacted] weeks gestation of pregnancy   - Discharge home - Keep scheduled appts with [facility] - Patient verbalized an understanding of the plan of care and agrees.   Ala Cart, CNM 12/06/2023, 10:18 AM

## 2023-12-06 NOTE — MAU Note (Signed)
 Diane Guerrero is a 28 y.o. at [redacted]w[redacted]d here in MAU reporting: this is her second preg.  Having a lot more problems with nausea this preg.  Had been keeping fluids down until yesterday.  Has a really bad HA now, took Tylenol - took edge off, thinks it is related.   Onset of complaint: worse last night Pain score: 4 Vitals:   12/06/23 0950  BP: 131/73  Pulse: 86  Resp: 18  Temp: 98.6 F (37 C)  SpO2: 100%     QYU:lwjaoz to hear with doppler Lab orders placed from triage:  urine

## 2024-02-03 ENCOUNTER — Inpatient Hospital Stay (HOSPITAL_COMMUNITY)
Admission: AD | Admit: 2024-02-03 | Discharge: 2024-02-03 | Disposition: A | Discharge status: Home / Self Care | Attending: Obstetrics and Gynecology | Admitting: Obstetrics and Gynecology

## 2024-02-03 DIAGNOSIS — W19XXXA Unspecified fall, initial encounter: Secondary | ICD-10-CM

## 2024-02-03 DIAGNOSIS — R0781 Pleurodynia: Secondary | ICD-10-CM | POA: Insufficient documentation

## 2024-02-03 DIAGNOSIS — M25551 Pain in right hip: Secondary | ICD-10-CM | POA: Diagnosis present

## 2024-02-03 DIAGNOSIS — Z3492 Encounter for supervision of normal pregnancy, unspecified, second trimester: Secondary | ICD-10-CM

## 2024-02-03 DIAGNOSIS — Z3A22 22 weeks gestation of pregnancy: Secondary | ICD-10-CM | POA: Diagnosis not present

## 2024-02-03 DIAGNOSIS — W1831XA Fall on same level due to stepping on an object, initial encounter: Secondary | ICD-10-CM | POA: Insufficient documentation

## 2024-02-03 DIAGNOSIS — O9A212 Injury, poisoning and certain other consequences of external causes complicating pregnancy, second trimester: Secondary | ICD-10-CM | POA: Insufficient documentation

## 2024-02-03 DIAGNOSIS — Z9181 History of falling: Secondary | ICD-10-CM | POA: Diagnosis present

## 2024-02-03 NOTE — MAU Note (Signed)
 S.[place],CNM assessed pt in triage. Gave fall precautions and reasons to return to MAU. Pt verbalized understanding.

## 2024-02-03 NOTE — MAU Provider Note (Signed)
"   None     Diane Guerrero is a 29 y.o. G2P1001 pregnant female at [redacted]w[redacted]d who presents to MAU today with complaint of minor fall this morning at 1100. She did not hit her abdomen, hit her side and back. No abdominal pain and site of impact just sore. Declines offer of medication for pain. She endorses fetal movement. Denies LOF or VB.   Receives care at [facility]. Prenatal records reviewed.  Pertinent items noted in HPI and remainder of comprehensive ROS otherwise negative.   O BP (!) 122/57   Pulse (!) 109   Temp 98.4 F (36.9 C)   Resp 18  Physical Exam Vitals reviewed.  Constitutional:      General: She is not in acute distress.    Appearance: Normal appearance. She is not ill-appearing, toxic-appearing or diaphoretic.  HENT:     Head: Normocephalic.  Cardiovascular:     Rate and Rhythm: Normal rate and regular rhythm.     Pulses: Normal pulses.     Heart sounds: Normal heart sounds.  Pulmonary:     Effort: Pulmonary effort is normal.     Breath sounds: Normal breath sounds.  Skin:    General: Skin is warm and dry.     Capillary Refill: Capillary refill takes less than 2 seconds.  Neurological:     General: No focal deficit present.     Mental Status: She is alert and oriented to person, place, and time.      MDM:  Low  - Patient with minor fall at 22 weeks. Fetal heart tones present. - No abdominal trauma, no VB or other concern.   MAU Course:  A Fall, initial encounter - Plan: Discharge patient  Presence of fetal heart sounds in second trimester  Medical screening exam complete  P Discharge from MAU in stable condition with return precautions. Abruption signs and symptoms reviewed with patient.  Follow up at [facility] as scheduled for ongoing prenatal care  Allergies as of 02/03/2024       Reactions   Fire Ant Hives   Peanut Oil    Suprax [cefixime]    Hives, itching        Medication List     TAKE these medications    acetaminophen  500 MG  tablet Commonly known as: TYLENOL  Take 500 mg by mouth every 6 (six) hours as needed.   clobetasol  ointment 0.05 % Commonly known as: TEMOVATE  Apply 1 Application topically at bedtime.   EPINEPHrine  0.3 mg/0.3 mL Soaj injection Commonly known as: EpiPen  2-Pak Inject 0.3 mg into the muscle as needed for anaphylaxis.   Folic Acid 5 MG Caps Take 1 capsule by mouth daily.   ondansetron  4 MG disintegrating tablet Commonly known as: ZOFRAN -ODT Take 4 mg by mouth every 8 (eight) hours as needed for nausea or vomiting.   One-A-Day Womens Prenatal 1 28-0.8-235 MG Caps Take 1 capsule by mouth daily.   Vitamin D  (Ergocalciferol ) 1.25 MG (50000 UNIT) Caps capsule Commonly known as: DRISDOL  Take 1 capsule (50,000 Units total) by mouth every 7 (seven) days.   Vitamin D3 50 MCG (2000 UT) capsule Take 2,000 Units by mouth daily.        Camie Rote, MSN, CNM 02/03/2024 3:26 PM  Certified Nurse Midwife, [facility]  "

## 2024-02-03 NOTE — MAU Note (Signed)
 Diane Guerrero is a 29 y.o. at [redacted]w[redacted]d here in MAU reporting: slipped on a toy and fell on her right hip  at 1100am today. Stated her hip and her ribs on her right side are a little sore but other than that she feels fine. Denies hitting abd.  Denies any vag bleeding or leaking. Or cramping. Called OB and they told her to come in.   LMP:  Onset of complaint:1100  Pain score: 4 Vitals:   02/03/24 1407  BP: (!) 122/57  Pulse: (!) 109  Resp: 18  Temp: 98.4 F (36.9 C)     FHT: 143  Lab orders placed from triage:

## 2024-02-18 ENCOUNTER — Other Ambulatory Visit: Payer: Self-pay | Admitting: Obstetrics and Gynecology

## 2024-02-18 DIAGNOSIS — Z3689 Encounter for other specified antenatal screening: Secondary | ICD-10-CM

## 2024-03-23 ENCOUNTER — Other Ambulatory Visit: Payer: PRIVATE HEALTH INSURANCE
